# Patient Record
Sex: Female | Born: 1950
Health system: Southern US, Community
[De-identification: ages and names within clinical notes are randomized; demographics above are authoritative.]

## PROBLEM LIST (undated history)

## (undated) DIAGNOSIS — M1990 Unspecified osteoarthritis, unspecified site: Secondary | ICD-10-CM

## (undated) DIAGNOSIS — D219 Benign neoplasm of connective and other soft tissue, unspecified: Secondary | ICD-10-CM

## (undated) DIAGNOSIS — I10 Essential (primary) hypertension: Secondary | ICD-10-CM

## (undated) DIAGNOSIS — K219 Gastro-esophageal reflux disease without esophagitis: Secondary | ICD-10-CM

## (undated) DIAGNOSIS — E7800 Pure hypercholesterolemia, unspecified: Secondary | ICD-10-CM

## (undated) HISTORY — PX: RETINAL TEAR REPAIR CRYOTHERAPY: SHX5304

## (undated) HISTORY — DX: Unspecified osteoarthritis, unspecified site: M19.90

## (undated) HISTORY — DX: Gastro-esophageal reflux disease without esophagitis: K21.9

## (undated) HISTORY — DX: Essential (primary) hypertension: I10

## (undated) HISTORY — DX: Benign neoplasm of connective and other soft tissue, unspecified: D21.9

---

## 1983-11-11 HISTORY — PX: ABDOMINAL HYSTERECTOMY: SHX81

## 1999-07-30 ENCOUNTER — Encounter: Payer: Self-pay | Admitting: Family Medicine

## 1999-07-30 ENCOUNTER — Ambulatory Visit (HOSPITAL_COMMUNITY): Admission: RE | Admit: 1999-07-30 | Discharge: 1999-07-30 | Payer: Self-pay | Admitting: Family Medicine

## 2001-10-29 ENCOUNTER — Other Ambulatory Visit: Admission: RE | Admit: 2001-10-29 | Discharge: 2001-10-29 | Payer: Self-pay | Admitting: Obstetrics and Gynecology

## 2003-07-27 ENCOUNTER — Ambulatory Visit (HOSPITAL_COMMUNITY): Admission: RE | Admit: 2003-07-27 | Discharge: 2003-07-27 | Payer: Self-pay | Admitting: Obstetrics and Gynecology

## 2003-07-27 ENCOUNTER — Encounter: Payer: Self-pay | Admitting: Obstetrics and Gynecology

## 2003-08-11 ENCOUNTER — Other Ambulatory Visit: Admission: RE | Admit: 2003-08-11 | Discharge: 2003-08-11 | Payer: Self-pay | Admitting: Obstetrics and Gynecology

## 2003-08-11 ENCOUNTER — Encounter: Admission: RE | Admit: 2003-08-11 | Discharge: 2003-08-11 | Payer: Self-pay | Admitting: Obstetrics and Gynecology

## 2003-08-11 ENCOUNTER — Encounter: Payer: Self-pay | Admitting: Obstetrics and Gynecology

## 2004-08-02 ENCOUNTER — Ambulatory Visit (HOSPITAL_COMMUNITY): Admission: RE | Admit: 2004-08-02 | Discharge: 2004-08-02 | Payer: Self-pay | Admitting: Obstetrics and Gynecology

## 2004-11-08 ENCOUNTER — Other Ambulatory Visit: Admission: RE | Admit: 2004-11-08 | Discharge: 2004-11-08 | Payer: Self-pay | Admitting: Obstetrics and Gynecology

## 2005-08-29 ENCOUNTER — Encounter: Admission: RE | Admit: 2005-08-29 | Discharge: 2005-08-29 | Payer: Self-pay | Admitting: Obstetrics and Gynecology

## 2005-11-14 ENCOUNTER — Other Ambulatory Visit: Admission: RE | Admit: 2005-11-14 | Discharge: 2005-11-14 | Payer: Self-pay | Admitting: Obstetrics and Gynecology

## 2006-03-20 ENCOUNTER — Ambulatory Visit (HOSPITAL_COMMUNITY): Admission: RE | Admit: 2006-03-20 | Discharge: 2006-03-20 | Payer: Self-pay | Admitting: *Deleted

## 2006-10-02 ENCOUNTER — Encounter: Admission: RE | Admit: 2006-10-02 | Discharge: 2006-10-02 | Payer: Self-pay | Admitting: Obstetrics and Gynecology

## 2007-05-21 ENCOUNTER — Other Ambulatory Visit: Admission: RE | Admit: 2007-05-21 | Discharge: 2007-05-21 | Payer: Self-pay | Admitting: Obstetrics and Gynecology

## 2007-11-09 ENCOUNTER — Encounter: Admission: RE | Admit: 2007-11-09 | Discharge: 2007-11-09 | Payer: Self-pay | Admitting: Obstetrics and Gynecology

## 2008-06-02 ENCOUNTER — Other Ambulatory Visit: Admission: RE | Admit: 2008-06-02 | Discharge: 2008-06-02 | Payer: Self-pay | Admitting: Obstetrics and Gynecology

## 2008-11-24 ENCOUNTER — Ambulatory Visit: Payer: Self-pay | Admitting: Women's Health

## 2008-12-22 ENCOUNTER — Encounter: Admission: RE | Admit: 2008-12-22 | Discharge: 2008-12-22 | Payer: Self-pay | Admitting: Obstetrics and Gynecology

## 2009-05-25 ENCOUNTER — Ambulatory Visit: Payer: Self-pay | Admitting: Women's Health

## 2009-06-08 ENCOUNTER — Other Ambulatory Visit: Admission: RE | Admit: 2009-06-08 | Discharge: 2009-06-08 | Payer: Self-pay | Admitting: Obstetrics and Gynecology

## 2009-06-08 ENCOUNTER — Encounter: Payer: Self-pay | Admitting: Obstetrics and Gynecology

## 2009-06-08 ENCOUNTER — Ambulatory Visit: Payer: Self-pay | Admitting: Obstetrics and Gynecology

## 2010-02-28 ENCOUNTER — Encounter: Admission: RE | Admit: 2010-02-28 | Discharge: 2010-02-28 | Payer: Self-pay | Admitting: Obstetrics and Gynecology

## 2010-08-09 ENCOUNTER — Ambulatory Visit: Payer: Self-pay | Admitting: Obstetrics and Gynecology

## 2010-08-09 ENCOUNTER — Other Ambulatory Visit: Admission: RE | Admit: 2010-08-09 | Discharge: 2010-08-09 | Payer: Self-pay | Admitting: Obstetrics and Gynecology

## 2010-08-16 ENCOUNTER — Ambulatory Visit: Payer: Self-pay | Admitting: Obstetrics and Gynecology

## 2010-10-10 ENCOUNTER — Ambulatory Visit: Payer: Self-pay | Admitting: Obstetrics and Gynecology

## 2011-03-04 ENCOUNTER — Other Ambulatory Visit: Payer: Self-pay | Admitting: Obstetrics and Gynecology

## 2011-03-04 DIAGNOSIS — Z1231 Encounter for screening mammogram for malignant neoplasm of breast: Secondary | ICD-10-CM

## 2011-03-07 ENCOUNTER — Ambulatory Visit
Admission: RE | Admit: 2011-03-07 | Discharge: 2011-03-07 | Disposition: A | Discharge status: Home / Self Care | Payer: Commercial Indemnity | Source: Ambulatory Visit | Attending: Obstetrics and Gynecology | Admitting: Obstetrics and Gynecology

## 2011-03-07 ENCOUNTER — Ambulatory Visit: Payer: Self-pay

## 2011-03-07 DIAGNOSIS — Z1231 Encounter for screening mammogram for malignant neoplasm of breast: Secondary | ICD-10-CM

## 2011-03-28 NOTE — Op Note (Signed)
NAMENISHI, NEISWONGER              ACCOUNT NO.:  [id number]   MEDICAL RECORD NO.:  [id number]          PATIENT TYPE:  AMB   LOCATION:  ENDO                         FACILITY:  [facility]   PHYSICIAN:  Georgiana Spinner, M.D.    DATE OF BIRTH:  17-Oct-1951   DATE OF PROCEDURE:  03/20/2006  DATE OF DISCHARGE:                                 OPERATIVE REPORT   PROCEDURE:  Colonoscopy.   INDICATIONS:  Colon cancer screening.   ANESTHESIA:  Demerol 100, Versed 9 mg.   PROCEDURE:  With the patient mildly sedated in the left lateral decubitus  position, the Olympus videoscopic colonoscope was inserted in the rectum and  passed under direct vision with pressure applied. We reached the cecum,  identified by ileocecal valve and appendiceal orifice, both of which were  photographed. From this point, the colonoscope was slowly withdrawn, taking  circumferential views of colonic mucosa stopping in the rectum which  appeared normal on direct and showed hemorrhoids on retroflexed view.  The  patient could not hold the air in very well to do a retroflexed view, but we  what we saw was just internal hemorrhoids.  The endoscope was straightened  and withdrawn.  The patient's vital signs and pulse oximeter remained  stable.  The patient tolerated procedure well without apparent  complications.   FINDINGS:  Internal hemorrhoids. Otherwise an unremarkable examination.   PLAN:  Consider repeat examination in 5 to 10 years.           ______________________________  Georgiana Spinner, M.D.     GMO/MEDQ  D:  03/20/2006  T:  03/21/2006  Job:  [id number]

## 2011-08-15 ENCOUNTER — Encounter: Payer: Commercial Indemnity | Admitting: Obstetrics and Gynecology

## 2011-08-18 ENCOUNTER — Encounter: Payer: Self-pay | Admitting: Gynecology

## 2011-08-18 DIAGNOSIS — M8580 Other specified disorders of bone density and structure, unspecified site: Secondary | ICD-10-CM | POA: Insufficient documentation

## 2011-08-18 DIAGNOSIS — M1990 Unspecified osteoarthritis, unspecified site: Secondary | ICD-10-CM | POA: Insufficient documentation

## 2011-08-18 DIAGNOSIS — D219 Benign neoplasm of connective and other soft tissue, unspecified: Secondary | ICD-10-CM | POA: Insufficient documentation

## 2011-08-22 ENCOUNTER — Ambulatory Visit (INDEPENDENT_AMBULATORY_CARE_PROVIDER_SITE_OTHER): Payer: Managed Care, Other (non HMO) | Admitting: Obstetrics and Gynecology

## 2011-08-22 ENCOUNTER — Other Ambulatory Visit (HOSPITAL_COMMUNITY)
Admission: RE | Admit: 2011-08-22 | Discharge: 2011-08-22 | Disposition: A | Discharge status: Home / Self Care | Payer: Managed Care, Other (non HMO) | Source: Ambulatory Visit | Attending: Obstetrics and Gynecology | Admitting: Obstetrics and Gynecology

## 2011-08-22 ENCOUNTER — Encounter: Payer: Self-pay | Admitting: Obstetrics and Gynecology

## 2011-08-22 VITALS — BP 120/70 | Ht 62.75 in | Wt 172.0 lb

## 2011-08-22 DIAGNOSIS — Z01419 Encounter for gynecological examination (general) (routine) without abnormal findings: Secondary | ICD-10-CM

## 2011-08-22 NOTE — Progress Notes (Signed)
The patient came back to see me today for her annual GYN exam. She is complaining of reduced libido and inability to have an orgasm. She seems to be doing well menopausally otherwise. Her last bone density was a year ago and she has low bone mass. She does not have an elevated FRAX risk. She is up-to-date on mammograms. She is having no pelvic pain or vaginal bleeding. She is her lab work through PCP.  ROS: 12 system review done. Only positive is arthritis.  HEENT: Within normal limits. Neck: No masses. Supraclavicular lymph nodes: Not enlarged. Breasts: Examined in both sitting and lying position. Symmetrical without skin changes or masses. Abdomen: Soft no masses guarding or rebound. No hernias. Pelvic: External within normal limits. BUS within normal limits. Vaginal examination shows good estrogen effect, no cystocele enterocele or rectocele. Cervix and uterus absent. Adnexa within normal limits. Rectovaginal confirmatory. Extremities within normal limits.   Assessment: Menopausal symptoms consisting of reduced libido.  Plan: Testosterone cream 2%. Explicit instructions given.

## 2012-02-20 ENCOUNTER — Other Ambulatory Visit: Payer: Self-pay | Admitting: Obstetrics and Gynecology

## 2012-02-20 DIAGNOSIS — Z1231 Encounter for screening mammogram for malignant neoplasm of breast: Secondary | ICD-10-CM

## 2012-03-09 ENCOUNTER — Ambulatory Visit
Admission: RE | Admit: 2012-03-09 | Discharge: 2012-03-09 | Disposition: A | Discharge status: Home / Self Care | Payer: Managed Care, Other (non HMO) | Source: Ambulatory Visit | Attending: Obstetrics and Gynecology | Admitting: Obstetrics and Gynecology

## 2012-03-09 DIAGNOSIS — Z1231 Encounter for screening mammogram for malignant neoplasm of breast: Secondary | ICD-10-CM

## 2012-08-26 ENCOUNTER — Encounter: Payer: Managed Care, Other (non HMO) | Admitting: Obstetrics and Gynecology

## 2012-10-01 ENCOUNTER — Encounter: Payer: Self-pay | Admitting: Obstetrics and Gynecology

## 2012-10-01 ENCOUNTER — Other Ambulatory Visit (HOSPITAL_COMMUNITY)
Admission: RE | Admit: 2012-10-01 | Discharge: 2012-10-01 | Disposition: A | Discharge status: Home / Self Care | Payer: Managed Care, Other (non HMO) | Source: Ambulatory Visit | Attending: Obstetrics and Gynecology | Admitting: Obstetrics and Gynecology

## 2012-10-01 ENCOUNTER — Ambulatory Visit (INDEPENDENT_AMBULATORY_CARE_PROVIDER_SITE_OTHER): Payer: Managed Care, Other (non HMO) | Admitting: Obstetrics and Gynecology

## 2012-10-01 VITALS — BP 130/80 | Ht 62.0 in | Wt 178.0 lb

## 2012-10-01 DIAGNOSIS — Z1151 Encounter for screening for human papillomavirus (HPV): Secondary | ICD-10-CM | POA: Insufficient documentation

## 2012-10-01 DIAGNOSIS — Z01419 Encounter for gynecological examination (general) (routine) without abnormal findings: Secondary | ICD-10-CM

## 2012-10-01 NOTE — Addendum Note (Signed)
Addended by: Dayna Barker on: 10/01/2012 09:51 AM   Modules accepted: Orders

## 2012-10-01 NOTE — Progress Notes (Signed)
Patient came to see me today for her annual GYN exam. She was complaining of vaginal dryness. She said we have given her cream a year ago but she didn't get it filled because of the expense. In reviewing her record this was testosterone cream not estrogen cream. She had a total abdominal hysterectomy in 1985 for symptomatic fibroids. She is having no vaginal bleeding. She is having no pelvic pain. She a normal mammogram this year. She has always had normal Pap smears. Her last Pap smear was 2012. She has change sexual partners since I last saw her. She has osteopenia on bone density without an elevated fracture risk. Her last bone density was 2011. She does her lab through PCP. She has had colonoscopy.  HEENT: Within normal limits.Kennon Portela present. Neck: No masses. Supraclavicular lymph nodes: Not enlarged. Breasts: Examined in both sitting and lying position. Symmetrical without skin changes or masses. Abdomen: Soft no masses guarding or rebound. No hernias. Pelvic: External within normal limits. BUS within normal limits. Vaginal examination shows poor estrogen effect, no cystocele enterocele or rectocele. Cervix and uterus absent. Adnexa within normal limits. Rectovaginal confirmatory.  Extremities within normal limits.  Assessment: #1. Atrophic vaginitis #2. Osteopenia  Plan: Explained to her that the testosterone was for libido and not for vaginal dryness. Discussed options including estrogen cream. She will use over-the-counter medication. Information on hyalo gyn  gel given. The new Pap smear guidelines were discussed with the patient. Pap done. Bone density. Continue yearly mammograms.

## 2012-10-01 NOTE — Patient Instructions (Addendum)
Schedule bone density.    

## 2012-10-02 LAB — URINALYSIS W MICROSCOPIC + REFLEX CULTURE
Casts: NONE SEEN
Crystals: NONE SEEN
Glucose, UA: NEGATIVE mg/dL
Hgb urine dipstick: NEGATIVE
Nitrite: NEGATIVE
Urobilinogen, UA: 0.2 mg/dL (ref 0.0–1.0)

## 2012-10-04 LAB — URINE CULTURE: Colony Count: >=100000

## 2012-10-05 ENCOUNTER — Other Ambulatory Visit: Payer: Self-pay | Admitting: Obstetrics and Gynecology

## 2012-10-05 DIAGNOSIS — N390 Urinary tract infection, site not specified: Secondary | ICD-10-CM

## 2012-10-05 MED ORDER — NITROFURANTOIN MONOHYD MACRO 100 MG PO CAPS: 100.0000 mg | ORAL_CAPSULE | Freq: Two times a day (BID) | ORAL | Status: AC

## 2012-10-12 ENCOUNTER — Encounter: Payer: Self-pay | Admitting: Obstetrics and Gynecology

## 2012-10-15 ENCOUNTER — Other Ambulatory Visit: Payer: Managed Care, Other (non HMO)

## 2012-10-15 DIAGNOSIS — N390 Urinary tract infection, site not specified: Secondary | ICD-10-CM

## 2012-10-16 LAB — URINALYSIS W MICROSCOPIC + REFLEX CULTURE
Casts: NONE SEEN
Glucose, UA: NEGATIVE mg/dL
Hgb urine dipstick: NEGATIVE
Ketones, ur: NEGATIVE mg/dL
Leukocytes, UA: NEGATIVE
Nitrite: NEGATIVE
Protein, ur: NEGATIVE mg/dL
Squamous Epithelial / LPF: NONE SEEN
Urobilinogen, UA: 0.2 mg/dL (ref 0.0–1.0)
pH: 6.5 (ref 5.0–8.0)

## 2012-11-15 ENCOUNTER — Other Ambulatory Visit: Payer: Self-pay | Admitting: Women's Health

## 2012-11-15 DIAGNOSIS — M8580 Other specified disorders of bone density and structure, unspecified site: Secondary | ICD-10-CM

## 2013-02-07 ENCOUNTER — Other Ambulatory Visit: Payer: Self-pay

## 2013-02-07 DIAGNOSIS — Z1231 Encounter for screening mammogram for malignant neoplasm of breast: Secondary | ICD-10-CM

## 2013-03-03 ENCOUNTER — Other Ambulatory Visit: Payer: Self-pay | Admitting: Gynecology

## 2013-03-03 DIAGNOSIS — M8580 Other specified disorders of bone density and structure, unspecified site: Secondary | ICD-10-CM

## 2013-03-10 ENCOUNTER — Ambulatory Visit (INDEPENDENT_AMBULATORY_CARE_PROVIDER_SITE_OTHER): Payer: Managed Care, Other (non HMO)

## 2013-03-10 ENCOUNTER — Encounter: Payer: Self-pay | Admitting: Gynecology

## 2013-03-10 ENCOUNTER — Ambulatory Visit
Admission: RE | Admit: 2013-03-10 | Discharge: 2013-03-10 | Disposition: A | Discharge status: Home / Self Care | Payer: Managed Care, Other (non HMO) | Source: Ambulatory Visit

## 2013-03-10 DIAGNOSIS — M949 Disorder of cartilage, unspecified: Secondary | ICD-10-CM

## 2013-03-10 DIAGNOSIS — Z1231 Encounter for screening mammogram for malignant neoplasm of breast: Secondary | ICD-10-CM

## 2013-03-10 DIAGNOSIS — M8580 Other specified disorders of bone density and structure, unspecified site: Secondary | ICD-10-CM

## 2013-03-11 ENCOUNTER — Ambulatory Visit: Payer: Managed Care, Other (non HMO)

## 2013-03-15 ENCOUNTER — Other Ambulatory Visit: Payer: Self-pay | Admitting: *Deleted

## 2013-03-15 DIAGNOSIS — M8580 Other specified disorders of bone density and structure, unspecified site: Secondary | ICD-10-CM

## 2013-03-17 ENCOUNTER — Other Ambulatory Visit: Payer: Managed Care, Other (non HMO)

## 2013-03-17 DIAGNOSIS — M8580 Other specified disorders of bone density and structure, unspecified site: Secondary | ICD-10-CM

## 2013-03-18 ENCOUNTER — Other Ambulatory Visit: Payer: Managed Care, Other (non HMO)

## 2013-03-18 LAB — VITAMIN D 25 HYDROXY (VIT D DEFICIENCY, FRACTURES): Vit D, 25-Hydroxy: 49 ng/mL (ref 30–89)

## 2013-10-04 ENCOUNTER — Encounter: Payer: Managed Care, Other (non HMO) | Admitting: Women's Health

## 2013-10-14 ENCOUNTER — Ambulatory Visit (INDEPENDENT_AMBULATORY_CARE_PROVIDER_SITE_OTHER): Payer: Managed Care, Other (non HMO) | Admitting: Women's Health

## 2013-10-14 ENCOUNTER — Encounter: Payer: Self-pay | Admitting: Women's Health

## 2013-10-14 VITALS — BP 124/82 | Ht 62.0 in | Wt 193.8 lb

## 2013-10-14 DIAGNOSIS — D259 Leiomyoma of uterus, unspecified: Secondary | ICD-10-CM

## 2013-10-14 DIAGNOSIS — D219 Benign neoplasm of connective and other soft tissue, unspecified: Secondary | ICD-10-CM

## 2013-10-14 DIAGNOSIS — Z01419 Encounter for gynecological examination (general) (routine) without abnormal findings: Secondary | ICD-10-CM

## 2013-10-14 LAB — URINALYSIS W MICROSCOPIC + REFLEX CULTURE
Ketones, ur: NEGATIVE mg/dL
Nitrite: NEGATIVE
pH: 6.5 (ref 5.0–8.0)

## 2013-10-14 NOTE — Patient Instructions (Signed)
Health Recommendations for Postmenopausal Women Respected and ongoing research has looked at the most common causes of death, disability, and poor quality of life in postmenopausal women. The causes include heart disease, diseases of blood vessels, diabetes, depression, cancer, and bone loss (osteoporosis). Many things can be done to help lower the chances of developing these and other common problems: CARDIOVASCULAR DISEASE Heart Disease: A heart attack is a medical emergency. Know the signs and symptoms of a heart attack. Below are things women can do to reduce their risk for heart disease.   Do not smoke. If you smoke, quit.  Aim for a healthy weight. Being overweight causes many preventable deaths. Eat a healthy and balanced diet and drink an adequate amount of liquids.  Get moving. Make a commitment to be more physically active. Aim for 30 minutes of activity on most, if not all days of the week.  Eat for heart health. Choose a diet that is low in saturated fat and cholesterol and eliminate trans fat. Include whole grains, vegetables, and fruits. Read and understand the labels on food containers before buying.  Know your numbers. Ask your caregiver to check your blood pressure, cholesterol (total, HDL, LDL, triglycerides) and blood glucose. Work with your caregiver on improving your entire clinical picture.  High blood pressure. Limit or stop your table salt intake (try salt substitute and food seasonings). Avoid salty foods and drinks. Read labels on food containers before buying. Eating well and exercising can help control high blood pressure. STROKE  Stroke is a medical emergency. Stroke may be the result of a blood clot in a blood vessel in the brain or by a brain hemorrhage (bleeding). Know the signs and symptoms of a stroke. To lower the risk of developing a stroke:  Avoid fatty foods.  Quit smoking.  Control your diabetes, blood pressure, and irregular heart rate. THROMBOPHLEBITIS  (BLOOD CLOT) OF THE LEG  Becoming overweight and leading a stationary lifestyle may also contribute to developing blood clots. Controlling your diet and exercising will help lower the risk of developing blood clots. CANCER SCREENING  Breast Cancer: Take steps to reduce your risk of breast cancer.  You should practice "breast self-awareness." This means understanding the normal appearance and feel of your breasts and should include breast self-examination. Any changes detected, no matter how small, should be reported to your caregiver.  After age 40, you should have a clinical breast exam (CBE) every year.  Starting at age 40, you should consider having a mammogram (breast X-ray) every year.  If you have a family history of breast cancer, talk to your caregiver about genetic screening.  If you are at high risk for breast cancer, talk to your caregiver about having an MRI and a mammogram every year.  Intestinal or Stomach Cancer: Tests to consider are a rectal exam, fecal occult blood, sigmoidoscopy, and colonoscopy. Women who are high risk may need to be screened at an earlier age and more often.  Cervical Cancer:  Beginning at age 30, you should have a Pap test every 3 years as long as the past 3 Pap tests have been normal.  If you have had past treatment for cervical cancer or a condition that could lead to cancer, you need Pap tests and screening for cancer for at least 20 years after your treatment.  If you had a hysterectomy for a problem that was not cancer or a condition that could lead to cancer, then you no longer need Pap tests.    If you are between ages 65 and 70, and you have had normal Pap tests going back 10 years, you no longer need Pap tests.  If Pap tests have been discontinued, risk factors (such as a new sexual partner) need to be reassessed to determine if screening should be resumed.  Some medical problems can increase the chance of getting cervical cancer. In these  cases, your caregiver may recommend more frequent screening and Pap tests.  Uterine Cancer: If you have vaginal bleeding after reaching menopause, you should notify your caregiver.  Ovarian cancer: Other than yearly pelvic exams, there are no reliable tests available to screen for ovarian cancer at this time except for yearly pelvic exams.  Lung Cancer: Yearly chest X-rays can detect lung cancer and should be done on high risk women, such as cigarette smokers and women with chronic lung disease (emphysema).  Skin Cancer: A complete body skin exam should be done at your yearly examination. Avoid overexposure to the sun and ultraviolet light lamps. Use a strong sun block cream when in the sun. All of these things are important in lowering the risk of skin cancer. MENOPAUSE Menopause Symptoms: Hormone therapy products are effective for treating symptoms associated with menopause:  Moderate to severe hot flashes.  Night sweats.  Mood swings.  Headaches.  Tiredness.  Loss of sex drive.  Insomnia.  Other symptoms. Hormone replacement carries certain risks, especially in older women. Women who use or are thinking about using estrogen or estrogen with progestin treatments should discuss that with their caregiver. Your caregiver will help you understand the benefits and risks. The ideal dose of hormone replacement therapy is not known. The Food and Drug Administration (FDA) has concluded that hormone therapy should be used only at the lowest doses and for the shortest amount of time to reach treatment goals.  OSTEOPOROSIS Protecting Against Bone Loss and Preventing Fracture: If you use hormone therapy for prevention of bone loss (osteoporosis), the risks for bone loss must outweigh the risk of the therapy. Ask your caregiver about other medications known to be safe and effective for preventing bone loss and fractures. To guard against bone loss or fractures, the following is recommended:  If  you are less than age 50, take 1000 mg of calcium and at least 600 mg of Vitamin D per day.  If you are greater than age 50 but less than age 70, take 1200 mg of calcium and at least 600 mg of Vitamin D per day.  If you are greater than age 70, take 1200 mg of calcium and at least 800 mg of Vitamin D per day. Smoking and excessive alcohol intake increases the risk of osteoporosis. Eat foods rich in calcium and vitamin D and do weight bearing exercises several times a week as your caregiver suggests. DIABETES Diabetes Melitus: If you have Type I or Type 2 diabetes, you should keep your blood sugar under control with diet, exercise and recommended medication. Avoid too many sweets, starchy and fatty foods. Being overweight can make control more difficult. COGNITION AND MEMORY Cognition and Memory: Menopausal hormone therapy is not recommended for the prevention of cognitive disorders such as Alzheimer's disease or memory loss.  DEPRESSION  Depression may occur at any age, but is common in elderly women. The reasons may be because of physical, medical, social (loneliness), or financial problems and needs. If you are experiencing depression because of medical problems and control of symptoms, talk to your caregiver about this. Physical activity and   exercise may help with mood and sleep. Community and volunteer involvement may help your sense of value and worth. If you have depression and you feel that the problem is getting worse or becoming severe, talk to your caregiver about treatment options that are best for you. ACCIDENTS  Accidents are common and can be serious in the elderly woman. Prepare your house to prevent accidents. Eliminate throw rugs, place hand bars in the bath, shower and toilet areas. Avoid wearing high heeled shoes or walking on wet, snowy, and icy areas. Limit or stop driving if you have vision or hearing problems, or you feel you are unsteady with you movements and  reflexes. HEPATITIS C Hepatitis C is a type of viral infection affecting the liver. It is spread mainly through contact with blood from an infected person. It can be treated, but if left untreated, it can lead to severe liver damage over years. Many people who are infected do not know that the virus is in their blood. If you are a "baby-boomer", it is recommended that you have one screening test for Hepatitis C. IMMUNIZATIONS  Several immunizations are important to consider having during your senior years, including:   Tetanus, diptheria, and pertussis booster shot.  Influenza every year before the flu season begins.  Pneumonia vaccine.  Shingles vaccine.  Others as indicated based on your specific needs. Talk to your caregiver about these. Document Released: 12/19/2005 Document Revised: 10/13/2012 Document Reviewed: 08/14/2008 [employer] Patient Information 2014 [employer].  

## 2013-10-14 NOTE — Progress Notes (Signed)
Heather Taylor [birth year] [id number]    History:    The patient presents for annual exam.  TAH for fibroids 1985/no HRT. Osteopenia T score -1.5 femoral neck FRAX 3.4%/0.3% 2014. Mammograms and Paps normal. Negative colonoscopy 2008. Same partner.   Past medical history, past surgical history, [name] Works in a factory. Mother hypothyroid/hypercholesterolemia. Father and brother heart disease/hypertension.   ROS:  A  ROS was performed and pertinent positives and negatives are included in the history.  Exam:  Filed Vitals:   10/14/13 0806  BP: 124/82    General appearance:  Normal Head/Neck:  Normal, without cervical or supraclavicular adenopathy. Thyroid:  Symmetrical, normal in size, without palpable masses or nodularity. Respiratory  Effort:  Normal  Auscultation:  Clear without wheezing or rhonchi Cardiovascular  Auscultation:  Regular rate, without rubs, murmurs or gallops  Edema/varicosities:  Not grossly evident Abdominal  Soft,nontender, without masses, guarding or rebound.  Liver/spleen:  No organomegaly noted  Hernia:  None appreciated  Skin  Inspection:  Grossly normal  Palpation:  Grossly normal Neurologic/psychiatric  Orientation:  Normal with appropriate conversation.  Mood/affect:  Normal  Genitourinary    Breasts: Examined lying and sitting.     Right: Without masses, retractions, discharge or axillary adenopathy.     Left: Without masses, retractions, discharge or axillary adenopathy.   Inguinal/mons:  Normal without inguinal adenopathy  External genitalia:  Normal  BUS/Urethra/Skene's glands:  Normal  Bladder:  Normal  Vagina:  Normal  Cervix:  Absent  Uterus:  Absent  Adnexa/parametria:     Rt: Without masses or tenderness.   Lt: Without masses or tenderness.  Anus and perineum: Normal  Digital rectal exam: Normal sphincter tone without palpated masses or  tenderness  Assessment/Plan:  62 y.o. DBF G0 for annual exam.   Obesity Osteopenia Osteoarthritis-primary care manages  Plan: Reviewed importance of home safety, fall prevention, regular exercise, calcium rich diet, vitamin D 2000 daily encouraged. SBE's, continue annual mammogram, 3D tomography reviewed and encouraged, history of dense breasts. UA, Fosamax vaccine reviewed and encouraged. Labs at primary care. Has gained 15 pounds in the past year reviewed importance of decreasing calories and increasing exercise for weight loss and maintenance.    Harrington Challenger [facility], 9:04 AM 10/14/2013

## 2013-10-16 LAB — URINE CULTURE: Colony Count: >=100000

## 2014-02-17 ENCOUNTER — Other Ambulatory Visit: Payer: Self-pay

## 2014-02-17 DIAGNOSIS — Z1231 Encounter for screening mammogram for malignant neoplasm of breast: Secondary | ICD-10-CM

## 2014-03-17 ENCOUNTER — Ambulatory Visit
Admission: RE | Admit: 2014-03-17 | Discharge: 2014-03-17 | Disposition: A | Discharge status: Home / Self Care | Payer: Managed Care, Other (non HMO) | Source: Ambulatory Visit

## 2014-03-17 ENCOUNTER — Encounter (INDEPENDENT_AMBULATORY_CARE_PROVIDER_SITE_OTHER): Payer: Self-pay

## 2014-03-17 DIAGNOSIS — Z1231 Encounter for screening mammogram for malignant neoplasm of breast: Secondary | ICD-10-CM

## 2014-10-20 ENCOUNTER — Encounter: Payer: Self-pay | Admitting: Women's Health

## 2014-10-20 ENCOUNTER — Ambulatory Visit (INDEPENDENT_AMBULATORY_CARE_PROVIDER_SITE_OTHER): Payer: Managed Care, Other (non HMO) | Admitting: Women's Health

## 2014-10-20 VITALS — BP 138/80 | Ht 61.0 in | Wt 198.0 lb

## 2014-10-20 DIAGNOSIS — Z01419 Encounter for gynecological examination (general) (routine) without abnormal findings: Secondary | ICD-10-CM

## 2014-10-20 NOTE — Progress Notes (Signed)
Heather Taylor [birth year] [id number]    History:    Presents for annual exam.  Postmenopausal no HRT. 1985 TAH for fibroids/menorrhagia. 2014 DEXA T score +1.5 FRAX 3.4%/0.3%. Normal Pap and mammogram history. 2008 negative colonoscopy.  Past medical history, past surgical history, [name] Works in a factory active job. Mother hypothyroid hypercholesteremia. Father, brother hypertension and heart disease.  ROS:  A  12 point ROS was performed and pertinent positives and negatives are included.  Exam:  Filed Vitals:   10/20/14 0953  BP: 138/80    General appearance:  Normal Thyroid:  Symmetrical, normal in size, without palpable masses or nodularity. Respiratory  Auscultation:  Clear without wheezing or rhonchi Cardiovascular  Auscultation:  Regular rate, without rubs, murmurs or gallops  Edema/varicosities:  Not grossly evident Abdominal  Soft,nontender, without masses, guarding or rebound.  Liver/spleen:  No organomegaly noted  Hernia:  None appreciated  Skin  Inspection:  Grossly normal   Breasts: Examined lying and sitting.     Right: Without masses, retractions, discharge or axillary adenopathy.     Left: Without masses, retractions, discharge or axillary adenopathy. Gentitourinary   Inguinal/mons:  Normal without inguinal adenopathy  External genitalia:  Normal  BUS/Urethra/Skene's glands:  Normal  Vagina:  Normal  Cervix:  And uterus absent  Adnexa/parametria:     Rt: Without masses or tenderness.   Lt: Without masses or tenderness.  Anus and perineum: Normal  Digital rectal exam: Normal sphincter tone without palpated masses or tenderness  Assessment/Plan:  63 y.o. DBF G0 for annual exam.    Obesity Postmenopausal TAH no HRT  Plan: SBE's, continue annual mammogram, calcium rich diet, vitamin D 2000 daily encouraged. Reports all labs as normal has annual exam with primary care in  January we'll get a vitamin D level checked. UA. Pap screening guidelines reviewed.. Zostavax recommended.    [name] [facility], 11:21 AM 10/20/2014

## 2014-10-20 NOTE — Patient Instructions (Signed)
Health Recommendations for Postmenopausal Women Respected and ongoing research has looked at the most common causes of death, disability, and poor quality of life in postmenopausal women. The causes include heart disease, diseases of blood vessels, diabetes, depression, cancer, and bone loss (osteoporosis). Many things can be done to help lower the chances of developing these and other common problems. CARDIOVASCULAR DISEASE Heart Disease: A heart attack is a medical emergency. Know the signs and symptoms of a heart attack. Below are things women can do to reduce their risk for heart disease.   Do not smoke. If you smoke, quit.  Aim for a healthy weight. Being overweight causes many preventable deaths. Eat a healthy and balanced diet and drink an adequate amount of liquids.  Get moving. Make a commitment to be more physically active. Aim for 30 minutes of activity on most, if not all days of the week.  Eat for heart health. Choose a diet that is low in saturated fat and cholesterol and eliminate trans fat. Include whole grains, vegetables, and fruits. Read and understand the labels on food containers before buying.  Know your numbers. Ask your caregiver to check your blood pressure, cholesterol (total, HDL, LDL, triglycerides) and blood glucose. Work with your caregiver on improving your entire clinical picture.  High blood pressure. Limit or stop your table salt intake (try salt substitute and food seasonings). Avoid salty foods and drinks. Read labels on food containers before buying. Eating well and exercising can help control high blood pressure. STROKE  Stroke is a medical emergency. Stroke may be the result of a blood clot in a blood vessel in the brain or by a brain hemorrhage (bleeding). Know the signs and symptoms of a stroke. To lower the risk of developing a stroke:  Avoid fatty foods.  Quit smoking.  Control your diabetes, blood pressure, and irregular heart rate. THROMBOPHLEBITIS  (BLOOD CLOT) OF THE LEG  Becoming overweight and leading a stationary lifestyle may also contribute to developing blood clots. Controlling your diet and exercising will help lower the risk of developing blood clots. CANCER SCREENING  Breast Cancer: Take steps to reduce your risk of breast cancer.  You should practice "breast self-awareness." This means understanding the normal appearance and feel of your breasts and should include breast self-examination. Any changes detected, no matter how small, should be reported to your caregiver.  After age 40, you should have a clinical breast exam (CBE) every year.  Starting at age 40, you should consider having a mammogram (breast X-ray) every year.  If you have a family history of breast cancer, talk to your caregiver about genetic screening.  If you are at high risk for breast cancer, talk to your caregiver about having an MRI and a mammogram every year.  Intestinal or Stomach Cancer: Tests to consider are a rectal exam, fecal occult blood, sigmoidoscopy, and colonoscopy. Women who are high risk may need to be screened at an earlier age and more often.  Cervical Cancer:  Beginning at age 30, you should have a Pap test every 3 years as long as the past 3 Pap tests have been normal.  If you have had past treatment for cervical cancer or a condition that could lead to cancer, you need Pap tests and screening for cancer for at least 20 years after your treatment.  If you had a hysterectomy for a problem that was not cancer or a condition that could lead to cancer, then you no longer need Pap tests.    If you are between ages 65 and 70, and you have had normal Pap tests going back 10 years, you no longer need Pap tests.  If Pap tests have been discontinued, risk factors (such as a new sexual partner) need to be reassessed to determine if screening should be resumed.  Some medical problems can increase the chance of getting cervical cancer. In these  cases, your caregiver may recommend more frequent screening and Pap tests.  Uterine Cancer: If you have vaginal bleeding after reaching menopause, you should notify your caregiver.  Ovarian Cancer: Other than yearly pelvic exams, there are no reliable tests available to screen for ovarian cancer at this time except for yearly pelvic exams.  Lung Cancer: Yearly chest X-rays can detect lung cancer and should be done on high risk women, such as cigarette smokers and women with chronic lung disease (emphysema).  Skin Cancer: A complete body skin exam should be done at your yearly examination. Avoid overexposure to the sun and ultraviolet light lamps. Use a strong sun block cream when in the sun. All of these things are important for lowering the risk of skin cancer. MENOPAUSE Menopause Symptoms: Hormone therapy products are effective for treating symptoms associated with menopause:  Moderate to severe hot flashes.  Night sweats.  Mood swings.  Headaches.  Tiredness.  Loss of sex drive.  Insomnia.  Other symptoms. Hormone replacement carries certain risks, especially in older women. Women who use or are thinking about using estrogen or estrogen with progestin treatments should discuss that with their caregiver. Your caregiver will help you understand the benefits and risks. The ideal dose of hormone replacement therapy is not known. The Food and Drug Administration (FDA) has concluded that hormone therapy should be used only at the lowest doses and for the shortest amount of time to reach treatment goals.  OSTEOPOROSIS Protecting Against Bone Loss and Preventing Fracture If you use hormone therapy for prevention of bone loss (osteoporosis), the risks for bone loss must outweigh the risk of the therapy. Ask your caregiver about other medications known to be safe and effective for preventing bone loss and fractures. To guard against bone loss or fractures, the following is recommended:  If  you are younger than age 50, take 1000 mg of calcium and at least 600 mg of Vitamin D per day.  If you are older than age 50 but younger than age 70, take 1200 mg of calcium and at least 600 mg of Vitamin D per day.  If you are older than age 70, take 1200 mg of calcium and at least 800 mg of Vitamin D per day. Smoking and excessive alcohol intake increases the risk of osteoporosis. Eat foods rich in calcium and vitamin D and do weight bearing exercises several times a week as your caregiver suggests. DIABETES Diabetes Mellitus: If you have type I or type 2 diabetes, you should keep your blood sugar under control with diet, exercise, and recommended medication. Avoid starchy and fatty foods, and too many sweets. Being overweight can make diabetes control more difficult. COGNITION AND MEMORY Cognition and Memory: Menopausal hormone therapy is not recommended for the prevention of cognitive disorders such as Alzheimer's disease or memory loss.  DEPRESSION  Depression may occur at any age, but it is common in elderly women. This may be because of physical, medical, social (loneliness), or financial problems and needs. If you are experiencing depression because of medical problems and control of symptoms, talk to your caregiver about this. Physical   activity and exercise may help with mood and sleep. Community and volunteer involvement may improve your sense of value and worth. If you have depression and you feel that the problem is getting worse or becoming severe, talk to your caregiver about which treatment options are best for you. ACCIDENTS  Accidents are common and can be serious in elderly woman. Prepare your house to prevent accidents. Eliminate throw rugs, place hand bars in bath, shower, and toilet areas. Avoid wearing high heeled shoes or walking on wet, snowy, and icy areas. Limit or stop driving if you have vision or hearing problems, or if you feel you are unsteady with your movements and  reflexes. HEPATITIS C Hepatitis C is a type of viral infection affecting the liver. It is spread mainly through contact with blood from an infected person. It can be treated, but if left untreated, it can lead to severe liver damage over the years. Many people who are infected do not know that the virus is in their blood. If you are a "baby-boomer", it is recommended that you have one screening test for Hepatitis C. IMMUNIZATIONS  Several immunizations are important to consider having during your senior years, including:   Tetanus, diphtheria, and pertussis booster shot.  Influenza every year before the flu season begins.  Pneumonia vaccine.  Shingles vaccine.  Others, as indicated based on your specific needs. Talk to your caregiver about these. Document Released: 12/19/2005 Document Revised: 03/13/2014 Document Reviewed: 08/14/2008 [employer] Patient Information 2015 [employer]. This information is not intended to replace advice given to you by your health care provider. Make sure you discuss any questions you have with your health care provider.  

## 2015-02-14 ENCOUNTER — Other Ambulatory Visit: Payer: Self-pay

## 2015-02-14 DIAGNOSIS — Z1231 Encounter for screening mammogram for malignant neoplasm of breast: Secondary | ICD-10-CM

## 2015-03-23 ENCOUNTER — Ambulatory Visit: Payer: Managed Care, Other (non HMO)

## 2015-03-30 ENCOUNTER — Ambulatory Visit
Admission: RE | Admit: 2015-03-30 | Discharge: 2015-03-30 | Disposition: A | Discharge status: Home / Self Care | Payer: Managed Care, Other (non HMO) | Source: Ambulatory Visit

## 2015-03-30 DIAGNOSIS — Z1231 Encounter for screening mammogram for malignant neoplasm of breast: Secondary | ICD-10-CM

## 2016-03-07 ENCOUNTER — Other Ambulatory Visit: Payer: Self-pay

## 2016-03-07 DIAGNOSIS — Z1231 Encounter for screening mammogram for malignant neoplasm of breast: Secondary | ICD-10-CM

## 2016-04-04 ENCOUNTER — Ambulatory Visit
Admission: RE | Admit: 2016-04-04 | Discharge: 2016-04-04 | Disposition: A | Discharge status: Home / Self Care | Payer: Managed Care, Other (non HMO) | Source: Ambulatory Visit

## 2016-04-04 DIAGNOSIS — Z1231 Encounter for screening mammogram for malignant neoplasm of breast: Secondary | ICD-10-CM

## 2016-05-09 ENCOUNTER — Ambulatory Visit (INDEPENDENT_AMBULATORY_CARE_PROVIDER_SITE_OTHER): Payer: Managed Care, Other (non HMO) | Admitting: Women's Health

## 2016-05-09 ENCOUNTER — Encounter: Payer: Self-pay | Admitting: Women's Health

## 2016-05-09 VITALS — BP 112/79 | Ht 62.0 in | Wt 191.6 lb

## 2016-05-09 DIAGNOSIS — M899 Disorder of bone, unspecified: Secondary | ICD-10-CM | POA: Diagnosis not present

## 2016-05-09 DIAGNOSIS — Z01419 Encounter for gynecological examination (general) (routine) without abnormal findings: Secondary | ICD-10-CM | POA: Diagnosis not present

## 2016-05-09 DIAGNOSIS — M8580 Other specified disorders of bone density and structure, unspecified site: Secondary | ICD-10-CM | POA: Diagnosis not present

## 2016-05-09 DIAGNOSIS — Z13820 Encounter for screening for osteoporosis: Secondary | ICD-10-CM

## 2016-05-09 DIAGNOSIS — Z113 Encounter for screening for infections with a predominantly sexual mode of transmission: Secondary | ICD-10-CM

## 2016-05-09 NOTE — Progress Notes (Signed)
Heather Taylor February 08, 1951 [id number]    History:    Presents for annual exam.  1985 TAH for fibroids and menorrhagia on no HRT. Normal Pap and mammogram history. New partner. 2014 T score -1.5 FRAX 3.4%/0.3%. 2008 negative colonoscopy. Primary care manages labs. Vitamin D 49. Reports normal blood sugar and lipid panel.  Past medical history, past surgical history, [name] Has an active job in a factory. Mother hypothyroid, hypercholesteremia. Brother hypertension and heart disease.  ROS:  A ROS was performed and pertinent positives and negatives are included.  Exam:  Filed Vitals:   05/09/16 0840  BP: 112/79    General appearance:  Normal Thyroid:  Symmetrical, normal in size, without palpable masses or nodularity. Respiratory  Auscultation:  Clear without wheezing or rhonchi Cardiovascular  Auscultation:  Regular rate, without rubs, murmurs or gallops  Edema/varicosities:  Not grossly evident Abdominal  Soft,nontender, without masses, guarding or rebound.  Liver/spleen:  No organomegaly noted  Hernia:  None appreciated  Skin  Inspection:  Grossly normal   Breasts: Examined lying and sitting.     Right: Without masses, retractions, discharge or axillary adenopathy.     Left: Without masses, retractions, discharge or axillary adenopathy. Gentitourinary   Inguinal/mons:  Normal without inguinal adenopathy  External genitalia:  Normal  BUS/Urethra/Skene's glands:  Normal  Vagina:  Normal  Cervix:  And uterus absent  Adnexa/parametria:     Rt: Without masses or tenderness.   Lt: Without masses or tenderness.  Anus and perineum: Normal  Digital rectal exam: Normal sphincter tone without palpated masses or tenderness  Assessment/Plan:  65 y.o. SBF G0 for annual exam no complaints.  1985 TAH for fibroids and menorrhagia on no HRT Obesity Osteopenia without elevated FRAX  Plan: GC/Chlamydia  culture from urethra, declines need for HIV, hepatitis or RPR. Condoms encouraged until permanent partner. Continue vaginal lubricants. SBE's, continue annual screening mammogram, calcium rich diet, vitamin D 1000 daily encouraged. Home safety, fall prevention and importance of weightbearing exercise reviewed. Schedule DEXA.  Heather Taylor Heather Taylor, 9:16 AM 05/09/2016

## 2016-05-09 NOTE — Patient Instructions (Signed)
Basic Carbohydrate Counting for Diabetes Mellitus Carbohydrate counting is a method for keeping track of the amount of carbohydrates you eat. Eating carbohydrates naturally increases the level of sugar (glucose) in your blood, so it is important for you to know the amount that is okay for you to have in every meal. Carbohydrate counting helps keep the level of glucose in your blood within normal limits. The amount of carbohydrates allowed is different for every person. A dietitian can help you calculate the amount that is right for you. Once you know the amount of carbohydrates you can have, you can count the carbohydrates in the foods you want to eat. Carbohydrates are found in the following foods:  Grains, such as breads and cereals.  Dried beans and soy products.  Starchy vegetables, such as potatoes, peas, and corn.  Fruit and fruit juices.  Milk and yogurt.  Sweets and snack foods, such as cake, cookies, candy, chips, soft drinks, and fruit drinks. CARBOHYDRATE COUNTING There are two ways to count the carbohydrates in your food. You can use either of the methods or a combination of both. Reading the "Nutrition Facts" on [place] The "Nutrition Facts" is an area that is included on the labels of almost all packaged food and beverages in the [place]. It includes the serving size of that food or beverage and information about the nutrients in each serving of the food, including the grams (g) of carbohydrate per serving.  Decide the number of servings of this food or beverage that you will be able to eat or drink. Multiply that number of servings by the number of grams of carbohydrate that is listed on the label for that serving. The total will be the amount of carbohydrates you will be having when you eat or drink this food or beverage. Learning Standard Serving Sizes of Food When you eat food that is not packaged or does not include "Nutrition Facts" on the label, you need to  measure the servings in order to count the amount of carbohydrates.A serving of most carbohydrate-rich foods contains about 15 g of carbohydrates. The following list includes serving sizes of carbohydrate-rich foods that provide 15 g ofcarbohydrate per serving:   1 slice of bread (1 oz) or 1 six-inch tortilla.    of a hamburger bun or English muffin.  4-6 crackers.   cup unsweetened dry cereal.    cup hot cereal.   cup rice or pasta.    cup mashed potatoes or  of a large baked potato.  1 cup fresh fruit or one small piece of fruit.    cup canned or frozen fruit or fruit juice.  1 cup milk.   cup plain fat-free yogurt or yogurt sweetened with artificial sweeteners.   cup cooked dried beans or starchy vegetable, such as peas, corn, or potatoes.  Decide the number of standard-size servings that you will eat. Multiply that number of servings by 15 (the grams of carbohydrates in that serving). For example, if you eat 2 cups of strawberries, you will have eaten 2 servings and 30 g of carbohydrates (2 servings x 15 g = 30 g). For foods such as soups and casseroles, in which more than one food is mixed in, you will need to count the carbohydrates in each food that is included. EXAMPLE OF CARBOHYDRATE COUNTING Sample Dinner  3 oz chicken breast.   cup of brown rice.   cup of corn.  1 cup milk.   1 cup strawberries with  sugar-free whipped topping.  Carbohydrate Calculation Step 1: Identify the foods that contain carbohydrates:   Rice.   Corn.   Milk.   Strawberries. Step 2:Calculate the number of servings eaten of each:   2 servings of rice.   1 serving of corn.   1 serving of milk.   1 serving of strawberries. Step 3: Multiply each of those number of servings by 15 g:   2 servings of rice x 15 g = 30 g.   1 serving of corn x 15 g = 15 g.   1 serving of milk x 15 g = 15 g.   1 serving of strawberries x 15 g = 15 g. Step 4: Add  together all of the amounts to find the total grams of carbohydrates eaten: 30 g + 15 g + 15 g + 15 g = 75 g.   This information is not intended to replace advice given to you by your health care provider. Make sure you discuss any questions you have with your health care provider.   Document Released: 10/27/2005 Document Revised: 11/17/2014 Document Reviewed: 09/23/2013 Elsevier Interactive Patient Education [employer]. Menopause is a normal process in which your reproductive ability comes to an end. This process happens gradually over a span of months to years, usually between the ages of 64 and 37. Menopause is complete when you have missed 12 consecutive menstrual periods. It is important to talk with your health care provider about some of the most common conditions that affect postmenopausal women, such as heart disease, cancer, and bone loss (osteoporosis). Adopting a healthy lifestyle and getting preventive care can help to promote your health and wellness. Those actions can also lower your chances of developing some of these common conditions. WHAT SHOULD I KNOW ABOUT MENOPAUSE? During menopause, you may experience a number of symptoms, such as:  Moderate-to-severe hot flashes.  Night sweats.  Decrease in sex drive.  Mood swings.  Headaches.  Tiredness.  Irritability.  Memory problems.  Insomnia. Choosing to treat or not to treat menopausal changes is an individual decision that you make with your health care provider. WHAT SHOULD I KNOW ABOUT HORMONE REPLACEMENT THERAPY AND SUPPLEMENTS? Hormone therapy products are effective for treating symptoms that are associated with menopause, such as hot flashes and night sweats. Hormone replacement carries certain risks, especially as you become older. If you are thinking about using estrogen or estrogen with progestin treatments, discuss the benefits and risks with your health care provider. WHAT SHOULD I KNOW ABOUT HEART  DISEASE AND STROKE? Heart disease, heart attack, and stroke become more likely as you age. This may be due, in part, to the hormonal changes that your body experiences during menopause. These can affect how your body processes dietary fats, triglycerides, and cholesterol. Heart attack and stroke are both medical emergencies. There are many things that you can do to help prevent heart disease and stroke:  Have your blood pressure checked at least every 1-2 years. High blood pressure causes heart disease and increases the risk of stroke.  If you are 18-23 years old, ask your health care provider if you should take aspirin to prevent a heart attack or a stroke.  Do not use any tobacco products, including cigarettes, chewing tobacco, or electronic cigarettes. If you need help quitting, ask your health care provider.  It is important to eat a healthy diet and maintain a healthy weight.  Be sure to include plenty of vegetables, fruits, low-fat dairy products,  and lean protein.  Avoid eating foods that are high in solid fats, added sugars, or salt (sodium).  Get regular exercise. This is one of the most important things that you can do for your health.  Try to exercise for at least 150 minutes each week. The type of exercise that you do should increase your heart rate and make you sweat. This is known as moderate-intensity exercise.  Try to do strengthening exercises at least twice each week. Do these in addition to the moderate-intensity exercise.  Know your numbers.Ask your health care provider to check your cholesterol and your blood glucose. Continue to have your blood tested as directed by your health care provider. WHAT SHOULD I KNOW ABOUT CANCER SCREENING? There are several types of cancer. Take the following steps to reduce your risk and to catch any cancer development as early as possible. Breast Cancer  Practice breast self-awareness.  This means understanding how your breasts  normally appear and feel.  It also means doing regular breast self-exams. Let your health care provider know about any changes, no matter how small.  If you are 6 or older, have a clinician do a breast exam (clinical breast exam or CBE) every year. Depending on your age, family history, and medical history, it may be recommended that you also have a yearly breast X-ray (mammogram).  If you have a family history of breast cancer, talk with your health care provider about genetic screening.  If you are at high risk for breast cancer, talk with your health care provider about having an MRI and a mammogram every year.  Breast cancer (BRCA) gene test is recommended for women who have family members with BRCA-related cancers. Results of the assessment will determine the need for genetic counseling and BRCA1 and for BRCA2 testing. BRCA-related cancers include these types:  Breast. This occurs in males or females.  Ovarian.  Tubal. This may also be called fallopian tube cancer.  Cancer of the abdominal or pelvic lining (peritoneal cancer).  Prostate.  Pancreatic. Cervical, Uterine, and Ovarian Cancer Your health care provider may recommend that you be screened regularly for cancer of the pelvic organs. These include your ovaries, uterus, and vagina. This screening involves a pelvic exam, which includes checking for microscopic changes to the surface of your cervix (Pap test).  For women ages 21-65, health care providers may recommend a pelvic exam and a Pap test every three years. For women ages 23-65, they may recommend the Pap test and pelvic exam, combined with testing for human papilloma virus (HPV), every five years. Some types of HPV increase your risk of cervical cancer. Testing for HPV may also be done on women of any age who have unclear Pap test results.  Other health care providers may not recommend any screening for nonpregnant women who are considered low risk for pelvic cancer and  have no symptoms. Ask your health care provider if a screening pelvic exam is right for you.  If you have had past treatment for cervical cancer or a condition that could lead to cancer, you need Pap tests and screening for cancer for at least 20 years after your treatment. If Pap tests have been discontinued for you, your risk factors (such as having a new sexual partner) need to be reassessed to determine if you should start having screenings again. Some women have medical problems that increase the chance of getting cervical cancer. In these cases, your health care provider may recommend that you have screening  and Pap tests more often.  If you have a family history of uterine cancer or ovarian cancer, talk with your health care provider about genetic screening.  If you have vaginal bleeding after reaching menopause, tell your health care provider.  There are currently no reliable tests available to screen for ovarian cancer. Lung Cancer Lung cancer screening is recommended for adults 16-68 years old who are at high risk for lung cancer because of a history of smoking. A yearly low-dose CT scan of the lungs is recommended if you:  Currently smoke.  Have a history of at least 30 pack-years of smoking and you currently smoke or have quit within the past 15 years. A pack-year is smoking an average of one pack of cigarettes per day for one year. Yearly screening should:  Continue until it has been 15 years since you quit.  Stop if you develop a health problem that would prevent you from having lung cancer treatment. Colorectal Cancer  This type of cancer can be detected and can often be prevented.  Routine colorectal cancer screening usually begins at age 39 and continues through age 67.  If you have risk factors for colon cancer, your health care provider may recommend that you be screened at an earlier age.  If you have a family history of colorectal cancer, talk with your health care  provider about genetic screening.  Your health care provider may also recommend using home test kits to check for hidden blood in your stool.  A small camera at the end of a tube can be used to examine your colon directly (sigmoidoscopy or colonoscopy). This is done to check for the earliest forms of colorectal cancer.  Direct examination of the colon should be repeated every 5-10 years until age 25. However, if early forms of precancerous polyps or small growths are found or if you have a family history or genetic risk for colorectal cancer, you may need to be screened more often. Skin Cancer  Check your skin from head to toe regularly.  Monitor any moles. Be sure to tell your health care provider:  About any new moles or changes in moles, especially if there is a change in a mole's shape or color.  If you have a mole that is larger than the size of a pencil eraser.  If any of your family members has a history of skin cancer, especially at a young age, talk with your health care provider about genetic screening.  Always use sunscreen. Apply sunscreen liberally and repeatedly throughout the day.  Whenever you are outside, protect yourself by wearing long sleeves, pants, a wide-brimmed hat, and sunglasses. WHAT SHOULD I KNOW ABOUT OSTEOPOROSIS? Osteoporosis is a condition in which bone destruction happens more quickly than new bone creation. After menopause, you may be at an increased risk for osteoporosis. To help prevent osteoporosis or the bone fractures that can happen because of osteoporosis, the following is recommended:  If you are 82-28 years old, get at least 1,000 mg of calcium and at least 600 mg of vitamin D per day.  If you are older than age 4 but younger than age 64, get at least 1,200 mg of calcium and at least 600 mg of vitamin D per day.  If you are older than age 73, get at least 1,200 mg of calcium and at least 800 mg of vitamin D per day. Smoking and excessive  alcohol intake increase the risk of osteoporosis. Eat foods that are rich  in calcium and vitamin D, and do weight-bearing exercises several times each week as directed by your health care provider. WHAT SHOULD I KNOW ABOUT HOW MENOPAUSE AFFECTS [place]? Depression may occur at any age, but it is more common as you become older. Common symptoms of depression include:  Low or sad mood.  Changes in sleep patterns.  Changes in appetite or eating patterns.  Feeling an overall lack of motivation or enjoyment of activities that you previously enjoyed.  Frequent crying spells. Talk with your health care provider if you think that you are experiencing depression. WHAT SHOULD I KNOW ABOUT IMMUNIZATIONS? It is important that you get and maintain your immunizations. These include:  Tetanus, diphtheria, and pertussis (Tdap) booster vaccine.  Influenza every year before the flu season begins.  Pneumonia vaccine.  Shingles vaccine. Your health care provider may also recommend other immunizations.   This information is not intended to replace advice given to you by your health care provider. Make sure you discuss any questions you have with your health care provider.   Document Released: 12/19/2005 Document Revised: 11/17/2014 Document Reviewed: 06/29/2014 Elsevier Interactive Patient Education [employer].

## 2016-05-10 LAB — GC/CHLAMYDIA PROBE AMP
CT Probe RNA: NOT DETECTED
GC Probe RNA: NOT DETECTED

## 2016-05-29 ENCOUNTER — Ambulatory Visit (INDEPENDENT_AMBULATORY_CARE_PROVIDER_SITE_OTHER): Payer: Managed Care, Other (non HMO)

## 2016-05-29 DIAGNOSIS — Z13820 Encounter for screening for osteoporosis: Secondary | ICD-10-CM | POA: Diagnosis not present

## 2016-05-29 DIAGNOSIS — M899 Disorder of bone, unspecified: Secondary | ICD-10-CM

## 2016-05-29 DIAGNOSIS — M8580 Other specified disorders of bone density and structure, unspecified site: Secondary | ICD-10-CM

## 2016-08-01 ENCOUNTER — Encounter: Payer: Self-pay | Admitting: Gastroenterology

## 2016-10-27 ENCOUNTER — Encounter: Payer: Managed Care, Other (non HMO) | Admitting: Gastroenterology

## 2016-11-21 ENCOUNTER — Ambulatory Visit (AMBULATORY_SURGERY_CENTER): Payer: Self-pay | Admitting: *Deleted

## 2016-11-21 DIAGNOSIS — Z1211 Encounter for screening for malignant neoplasm of colon: Secondary | ICD-10-CM

## 2016-11-21 MED ORDER — NA SULFATE-K SULFATE-MG SULF 17.5-3.13-1.6 GM/177ML PO SOLN
1.0000 | Freq: Once | ORAL | 0 refills | Status: AC
Start: 2016-11-21 — End: 2016-11-21

## 2016-11-21 NOTE — Progress Notes (Signed)
No allergies to eggs or soy. No problems with anesthesia.  Pt given Emmi instructions for colonoscopy  No oxygen use  No diet drug use  

## 2016-12-12 ENCOUNTER — Encounter: Payer: Self-pay | Admitting: Gastroenterology

## 2016-12-12 ENCOUNTER — Ambulatory Visit (AMBULATORY_SURGERY_CENTER): Payer: Managed Care, Other (non HMO) | Admitting: Gastroenterology

## 2016-12-12 VITALS — BP 110/76 | HR 74 | Temp 97.3°F | Resp 22 | Ht 62.0 in | Wt 191.0 lb

## 2016-12-12 DIAGNOSIS — Z1212 Encounter for screening for malignant neoplasm of rectum: Secondary | ICD-10-CM

## 2016-12-12 DIAGNOSIS — Z1211 Encounter for screening for malignant neoplasm of colon: Secondary | ICD-10-CM | POA: Diagnosis not present

## 2016-12-12 DIAGNOSIS — K633 Ulcer of intestine: Secondary | ICD-10-CM | POA: Diagnosis not present

## 2016-12-12 DIAGNOSIS — D122 Benign neoplasm of ascending colon: Secondary | ICD-10-CM | POA: Diagnosis not present

## 2016-12-12 MED ORDER — SODIUM CHLORIDE 0.9 % IV SOLN: 500.0000 mL | INTRAVENOUS | Status: DC

## 2016-12-12 NOTE — Op Note (Signed)
[facility] Patient Name: Heather Taylor Procedure Date: 12/12/2016 8:02 AM MRN: [id number] Endoscopist: [place]. Loletha Carrow , MD Age: 66 Referring MD:  Date of Birth: September 30, 1951 Gender: Female Account #: [id number] Procedure:                Colonoscopy Indications:              Screening for colorectal malignant neoplasm (no                            polyps on 2007 colonoscopy) Medicines:                Monitored Anesthesia Care Procedure:                Pre-Anesthesia Assessment:                           - Prior to the procedure, a History and Physical                            was performed, and patient medications and                            allergies were reviewed. The patient's tolerance of                            previous anesthesia was also reviewed. The risks                            and benefits of the procedure and the sedation                            options and risks were discussed with the patient.                            All questions were answered, and informed consent                            was obtained. Anticoagulants: The patient has taken                            aspirin. It was decided not to withhold this                            medication prior to the procedure. ASA Grade                            Assessment: II - A patient with mild systemic                            disease. After reviewing the risks and benefits,                            the patient was deemed in satisfactory condition to  undergo the procedure.                           After obtaining informed consent, the colonoscope                            was passed under direct vision. Throughout the                            procedure, the patient's blood pressure, pulse, and                            oxygen saturations were monitored continuously. The                            Model CF-HQ190L [id number]) scope was introduced                     through the anus and advanced to the the cecum,                            identified by appendiceal orifice and ileocecal                            valve. The colonoscopy was performed without                            difficulty. The patient tolerated the procedure                            well. The quality of the bowel preparation was                            excellent. The ileocecal valve, appendiceal                            orifice, and rectum were photographed. The quality                            of the bowel preparation was evaluated using the                            BBPS [facility] Bowel Preparation Scale) with scores                            of: Right Colon = 3, Transverse Colon = 3 and Left                            Colon = 2. The total BBPS score equals 8. The bowel                            preparation used was SUPREP. Scope In: 8:06:29 AM Scope Out: 8:23:17 AM Scope Withdrawal Time: 0 hours 10 minutes 48 seconds  Total Procedure Duration: 0 hours 16 minutes 48  seconds  Findings:                 The digital rectal exam findings include decreased                            sphincter tone.                           A 8 mm polyp was found in the mid ascending colon.                            The polyp was sessile. The polyp was removed with a                            cold snare. Resection and retrieval were complete.                           A single (solitary) 20 mm linear, benign-appearing                            ulcer was found at the splenic flexure. No bleeding                            was present. No stigmata of recent bleeding were                            seen. Biopsies were taken with a cold forceps for                            histology.                           Multiple medium-mouthed diverticula were found in                            the distal ascending colon and left colon.                           Retroflexion in  the rectum was not performed due to                            anatomy.                           The exam was otherwise without abnormality. Complications:            No immediate complications. Estimated Blood Loss:     Estimated blood loss: none. Impression:               - Decreased sphincter tone found on digital rectal                            exam.                           - One 8 mm  polyp in the mid ascending colon,                            removed with a cold snare. Resected and retrieved.                           - A single (solitary) ulcer at the splenic flexure.                            Biopsied. Appears most likely to be aspirin-induced.                           - Diverticulosis in the distal ascending colon and                            in the left colon.                           - The examination was otherwise normal. Recommendation:           - Patient has a contact number available for                            emergencies. The signs and symptoms of potential                            delayed complications were discussed with the                            patient. Return to normal activities tomorrow.                            Written discharge instructions were provided to the                            patient.                           - Resume previous diet.                           - Discontinue aspirin and NSAIDs for 2 months, then                            patient should consult with primary care about how                            strongly aspirin is indicated in the future.                           - Await pathology results.                           - Repeat colonoscopy is recommended for  surveillance. The colonoscopy date will be                            determined after pathology results from today's                            exam become available for review. Henry L. Loletha Carrow, MD 12/12/2016 8:30:21 AM This report  has been signed electronically.

## 2016-12-12 NOTE — Progress Notes (Signed)
Called to room to assist during endoscopic procedure.  Patient ID and intended procedure confirmed with present staff. Received instructions for my participation in the procedure from the performing physician.  

## 2016-12-12 NOTE — Progress Notes (Signed)
To recovery, report to [place], RN, VSS 

## 2016-12-12 NOTE — Patient Instructions (Signed)
YOU HAD AN ENDOSCOPIC PROCEDURE TODAY AT [facility]:   Refer to the procedure report that was given to you for any specific questions about what was found during the examination.  If the procedure report does not answer your questions, please call your gastroenterologist to clarify.  If you requested that your care partner not be given the details of your procedure findings, then the procedure report has been included in a sealed envelope for you to review at your convenience later.  YOU SHOULD EXPECT: Some feelings of bloating in the abdomen. Passage of more gas than usual.  Walking can help get rid of the air that was put into your GI tract during the procedure and reduce the bloating. If you had a lower endoscopy (such as a colonoscopy or flexible sigmoidoscopy) you may notice spotting of blood in your stool or on the toilet paper. If you underwent a bowel prep for your procedure, you may not have a normal bowel movement for a few days.  Please Note:  You might notice some irritation and congestion in your nose or some drainage.  This is from the oxygen used during your procedure.  There is no need for concern and it should clear up in a day or so.  SYMPTOMS TO REPORT IMMEDIATELY:   Following lower endoscopy (colonoscopy or flexible sigmoidoscopy):  Excessive amounts of blood in the stool  Significant tenderness or worsening of abdominal pains  Swelling of the abdomen that is new, acute  Fever of 100F or higher   For urgent or emergent issues, a gastroenterologist can be reached at any hour by calling [phone].   DIET:  We do recommend a small meal at first, but then you may proceed to your regular diet.  Drink plenty of fluids but you should avoid alcoholic beverages for 24 hours. Try to increase the fiber in your diet, and drink plenty of water.  ACTIVITY:  You should plan to take it easy for the rest of today and you should NOT DRIVE or use heavy machinery until  tomorrow (because of the sedation medicines used during the test).    FOLLOW UP: Our staff will call the number listed on your records the next business day following your procedure to check on you and address any questions or concerns that you may have regarding the information given to you following your procedure. If we do not reach you, we will leave a message.  However, if you are feeling well and you are not experiencing any problems, there is no need to return our call.  We will assume that you have returned to your regular daily activities without incident.  If any biopsies were taken you will be contacted by phone or by letter within the next 1-3 weeks.  Please call us at [phone] if you have not heard about the biopsies in 3 weeks.    SIGNATURES/CONFIDENTIALITY: You and/or your care partner have signed paperwork which will be entered into your electronic medical record.  These signatures attest to the fact that that the information above on your After Visit Summary has been reviewed and is understood.  Full responsibility of the confidentiality of this discharge information lies with you and/or your care-partner.  Do not use NSAIDS: aspirin, aleve, ibuprofen for 2 months per Dr. Loletha Carrow.  Thank-you for choosing [place] for your healthcare needs today.

## 2016-12-15 ENCOUNTER — Telehealth: Payer: Self-pay

## 2016-12-15 NOTE — Telephone Encounter (Signed)
  Follow up Call-  Call back number 12/12/2016  Post procedure Call Back phone  # [phone]  Permission to leave phone message Yes  Some recent data might be hidden    Patient was called for follow up after her procedure on 12/12/2016. No answer at the number given for follow up phone call. A message was left on the answering machine.

## 2016-12-15 NOTE — Telephone Encounter (Signed)
  Follow up Call-  Call back number 12/12/2016  Post procedure Call Back phone  # [phone]  Permission to leave phone message Yes  Some recent data might be hidden    Patient was called for follow up after her procedure on 12/12/2016. I spoke with the patients mother and she reports that Heather Taylor has returned to her normal daily activities without any complications.

## 2016-12-17 ENCOUNTER — Encounter: Payer: Self-pay | Admitting: Gastroenterology

## 2017-02-26 ENCOUNTER — Other Ambulatory Visit: Payer: Self-pay | Admitting: Internal Medicine

## 2017-02-26 DIAGNOSIS — Z1231 Encounter for screening mammogram for malignant neoplasm of breast: Secondary | ICD-10-CM

## 2017-03-25 ENCOUNTER — Encounter: Payer: Self-pay | Admitting: Gynecology

## 2017-04-10 ENCOUNTER — Ambulatory Visit
Admission: RE | Admit: 2017-04-10 | Discharge: 2017-04-10 | Disposition: A | Discharge status: Home / Self Care | Payer: Managed Care, Other (non HMO) | Source: Ambulatory Visit | Attending: Internal Medicine | Admitting: Internal Medicine

## 2017-04-10 DIAGNOSIS — Z1231 Encounter for screening mammogram for malignant neoplasm of breast: Secondary | ICD-10-CM

## 2017-04-17 ENCOUNTER — Ambulatory Visit: Payer: Managed Care, Other (non HMO) | Admitting: Podiatry

## 2017-04-24 ENCOUNTER — Ambulatory Visit (INDEPENDENT_AMBULATORY_CARE_PROVIDER_SITE_OTHER): Payer: Managed Care, Other (non HMO) | Admitting: Podiatry

## 2017-04-24 ENCOUNTER — Encounter: Payer: Self-pay | Admitting: Podiatry

## 2017-04-24 DIAGNOSIS — B351 Tinea unguium: Secondary | ICD-10-CM

## 2017-04-24 DIAGNOSIS — L608 Other nail disorders: Secondary | ICD-10-CM

## 2017-04-24 NOTE — Progress Notes (Signed)
   Subjective:    Patient ID: Leiah Giannotti, female    DOB: 07-26-1951, 66 y.o.   MRN: [id number]  HPI  Chief Complaint  Patient presents with  . Nail Problem    BL- Nails discolored/thick   66 year old female presents the office today for concerns of thick, discolored toenails to both of her big toenails. She denies any swelling redness or any drainage. She states that she has no pain to the toenails. She said no treatment for this and his been ongoing for couple years at this time. She has no other concerns.    Review of Systems  All other systems reviewed and are negative.      Objective:   Physical Exam General: AAO x3, NAD  Dermatological: Bilateral hallux toenails are dystrophic, discolored yellow to brown discoloration. Is no pain to the toenail is no swelling redness or drainage. No open lesions are identified.  Vascular: Dorsalis Pedis artery and Posterior Tibial artery pedal pulses are 2/4 bilateral with immedate capillary fill time. There is no pain with calf compression, swelling, warmth, erythema.   Neruologic: Grossly intact via light touch bilateral. Vibratory intact via tuning fork bilateral. Protective threshold with Semmes Wienstein monofilament intact to all pedal sites bilateral.   Musculoskeletal: No gross boney pedal deformities bilateral. No pain, crepitus, or limitation noted with foot and ankle range of motion bilateral. Muscular strength 5/5 in all groups tested bilateral.  Gait: Unassisted, Nonantalgic.      Assessment & Plan:  66 year old female with bilateral hallux onychodystrophy, likely onychomycosis -Treatment options discussed including all alternatives, risks, and complications -Etiology of symptoms were discussed -Discussed treatment options. After discussion in regards the option she's not to proceed with topical treatment. I ordered a compound cream for onychomycosis. This is ordered through [employer].  -Discussed other measures in regards  to changing shoes and socks. -Follow-up as needed. Encouraged to call any questions or concerns or any change in symptoms.  Celesta Gentile, DPM

## 2017-05-15 ENCOUNTER — Encounter: Payer: Managed Care, Other (non HMO) | Admitting: Women's Health

## 2017-05-20 ENCOUNTER — Encounter: Payer: Managed Care, Other (non HMO) | Admitting: Women's Health

## 2017-05-25 ENCOUNTER — Encounter: Payer: Self-pay | Admitting: Women's Health

## 2017-05-25 ENCOUNTER — Ambulatory Visit (INDEPENDENT_AMBULATORY_CARE_PROVIDER_SITE_OTHER): Payer: Managed Care, Other (non HMO) | Admitting: Women's Health

## 2017-05-25 VITALS — BP 122/78 | Ht 61.0 in | Wt 188.0 lb

## 2017-05-25 DIAGNOSIS — Z01419 Encounter for gynecological examination (general) (routine) without abnormal findings: Secondary | ICD-10-CM | POA: Diagnosis not present

## 2017-05-25 NOTE — Patient Instructions (Signed)
Health Maintenance for Postmenopausal Women Menopause is a normal process in which your reproductive ability comes to an end. This process happens gradually over a span of months to years, usually between the ages of 22 and 9. Menopause is complete when you have missed 12 consecutive menstrual periods. It is important to talk with your health care provider about some of the most common conditions that affect postmenopausal women, such as heart disease, cancer, and bone loss (osteoporosis). Adopting a healthy lifestyle and getting preventive care can help to promote your health and wellness. Those actions can also lower your chances of developing some of these common conditions. What should I know about menopause? During menopause, you may experience a number of symptoms, such as:  Moderate-to-severe hot flashes.  Night sweats.  Decrease in sex drive.  Mood swings.  Headaches.  Tiredness.  Irritability.  Memory problems.  Insomnia.  Choosing to treat or not to treat menopausal changes is an individual decision that you make with your health care provider. What should I know about hormone replacement therapy and supplements? Hormone therapy products are effective for treating symptoms that are associated with menopause, such as hot flashes and night sweats. Hormone replacement carries certain risks, especially as you become older. If you are thinking about using estrogen or estrogen with progestin treatments, discuss the benefits and risks with your health care provider. What should I know about heart disease and stroke? Heart disease, heart attack, and stroke become more likely as you age. This may be due, in part, to the hormonal changes that your body experiences during menopause. These can affect how your body processes dietary fats, triglycerides, and cholesterol. Heart attack and stroke are both medical emergencies. There are many things that you can do to help prevent heart disease  and stroke:  Have your blood pressure checked at least every 1-2 years. High blood pressure causes heart disease and increases the risk of stroke.  If you are 53-22 years old, ask your health care provider if you should take aspirin to prevent a heart attack or a stroke.  Do not use any tobacco products, including cigarettes, chewing tobacco, or electronic cigarettes. If you need help quitting, ask your health care provider.  It is important to eat a healthy diet and maintain a healthy weight. ? Be sure to include plenty of vegetables, fruits, low-fat dairy products, and lean protein. ? Avoid eating foods that are high in solid fats, added sugars, or salt (sodium).  Get regular exercise. This is one of the most important things that you can do for your health. ? Try to exercise for at least 150 minutes each week. The type of exercise that you do should increase your heart rate and make you sweat. This is known as moderate-intensity exercise. ? Try to do strengthening exercises at least twice each week. Do these in addition to the moderate-intensity exercise.  Know your numbers.Ask your health care provider to check your cholesterol and your blood glucose. Continue to have your blood tested as directed by your health care provider.  What should I know about cancer screening? There are several types of cancer. Take the following steps to reduce your risk and to catch any cancer development as early as possible. Breast Cancer  Practice breast self-awareness. ? This means understanding how your breasts normally appear and feel. ? It also means doing regular breast self-exams. Let your health care provider know about any changes, no matter how small.  If you are 40  or older, have a clinician do a breast exam (clinical breast exam or CBE) every year. Depending on your age, family history, and medical history, it may be recommended that you also have a yearly breast X-ray (mammogram).  If you  have a family history of breast cancer, talk with your health care provider about genetic screening.  If you are at high risk for breast cancer, talk with your health care provider about having an MRI and a mammogram every year.  Breast cancer (BRCA) gene test is recommended for women who have family members with BRCA-related cancers. Results of the assessment will determine the need for genetic counseling and BRCA1 and for BRCA2 testing. BRCA-related cancers include these types: ? Breast. This occurs in males or females. ? Ovarian. ? Tubal. This may also be called fallopian tube cancer. ? Cancer of the abdominal or pelvic lining (peritoneal cancer). ? Prostate. ? Pancreatic.  Cervical, Uterine, and Ovarian Cancer Your health care provider may recommend that you be screened regularly for cancer of the pelvic organs. These include your ovaries, uterus, and vagina. This screening involves a pelvic exam, which includes checking for microscopic changes to the surface of your cervix (Pap test).  For women ages 21-65, health care providers may recommend a pelvic exam and a Pap test every three years. For women ages 79-65, they may recommend the Pap test and pelvic exam, combined with testing for human papilloma virus (HPV), every five years. Some types of HPV increase your risk of cervical cancer. Testing for HPV may also be done on women of any age who have unclear Pap test results.  Other health care providers may not recommend any screening for nonpregnant women who are considered low risk for pelvic cancer and have no symptoms. Ask your health care provider if a screening pelvic exam is right for you.  If you have had past treatment for cervical cancer or a condition that could lead to cancer, you need Pap tests and screening for cancer for at least 20 years after your treatment. If Pap tests have been discontinued for you, your risk factors (such as having a new sexual partner) need to be  reassessed to determine if you should start having screenings again. Some women have medical problems that increase the chance of getting cervical cancer. In these cases, your health care provider may recommend that you have screening and Pap tests more often.  If you have a family history of uterine cancer or ovarian cancer, talk with your health care provider about genetic screening.  If you have vaginal bleeding after reaching menopause, tell your health care provider.  There are currently no reliable tests available to screen for ovarian cancer.  Lung Cancer Lung cancer screening is recommended for adults 69-62 years old who are at high risk for lung cancer because of a history of smoking. A yearly low-dose CT scan of the lungs is recommended if you:  Currently smoke.  Have a history of at least 30 pack-years of smoking and you currently smoke or have quit within the past 15 years. A pack-year is smoking an average of one pack of cigarettes per day for one year.  Yearly screening should:  Continue until it has been 15 years since you quit.  Stop if you develop a health problem that would prevent you from having lung cancer treatment.  Colorectal Cancer  This type of cancer can be detected and can often be prevented.  Routine colorectal cancer screening usually begins at  age 42 and continues through age 45.  If you have risk factors for colon cancer, your health care provider may recommend that you be screened at an earlier age.  If you have a family history of colorectal cancer, talk with your health care provider about genetic screening.  Your health care provider may also recommend using home test kits to check for hidden blood in your stool.  A small camera at the end of a tube can be used to examine your colon directly (sigmoidoscopy or colonoscopy). This is done to check for the earliest forms of colorectal cancer.  Direct examination of the colon should be repeated every  5-10 years until age 71. However, if early forms of precancerous polyps or small growths are found or if you have a family history or genetic risk for colorectal cancer, you may need to be screened more often.  Skin Cancer  Check your skin from head to toe regularly.  Monitor any moles. Be sure to tell your health care provider: ? About any new moles or changes in moles, especially if there is a change in a mole's shape or color. ? If you have a mole that is larger than the size of a pencil eraser.  If any of your family members has a history of skin cancer, especially at a Shenia Alan age, talk with your health care provider about genetic screening.  Always use sunscreen. Apply sunscreen liberally and repeatedly throughout the day.  Whenever you are outside, protect yourself by wearing long sleeves, pants, a wide-brimmed hat, and sunglasses.  What should I know about osteoporosis? Osteoporosis is a condition in which bone destruction happens more quickly than new bone creation. After menopause, you may be at an increased risk for osteoporosis. To help prevent osteoporosis or the bone fractures that can happen because of osteoporosis, the following is recommended:  If you are 46-71 years old, get at least 1,000 mg of calcium and at least 600 mg of vitamin D per day.  If you are older than age 55 but younger than age 65, get at least 1,200 mg of calcium and at least 600 mg of vitamin D per day.  If you are older than age 54, get at least 1,200 mg of calcium and at least 800 mg of vitamin D per day.  Smoking and excessive alcohol intake increase the risk of osteoporosis. Eat foods that are rich in calcium and vitamin D, and do weight-bearing exercises several times each week as directed by your health care provider. What should I know about how menopause affects my mental health? Depression may occur at any age, but it is more common as you become older. Common symptoms of depression  include:  Low or sad mood.  Changes in sleep patterns.  Changes in appetite or eating patterns.  Feeling an overall lack of motivation or enjoyment of activities that you previously enjoyed.  Frequent crying spells.  Talk with your health care provider if you think that you are experiencing depression. What should I know about immunizations? It is important that you get and maintain your immunizations. These include:  Tetanus, diphtheria, and pertussis (Tdap) booster vaccine.  Influenza every year before the flu season begins.  Pneumonia vaccine.  Shingles vaccine.  Your health care provider may also recommend other immunizations. This information is not intended to replace advice given to you by your health care provider. Make sure you discuss any questions you have with your health care provider. Document Released: 12/19/2005  Document Revised: 05/16/2016 Document Reviewed: 07/31/2015 Elsevier Interactive Patient Education  2018 [employer] Inc.  

## 2017-05-25 NOTE — Addendum Note (Signed)
Addended by: Joaquin Music on: 05/25/2017 03:15 PM   Modules accepted: Orders

## 2017-05-25 NOTE — Progress Notes (Signed)
Heather Taylor 10-17-1951 [id number]    History:    Presents for annual exam.  1985 TAH for fibroids on no HRT. Same partner. 2017 T score negative 1.7 without elevated FRAX. Normal Pap and mammogram history. 12/2016 benign colon polyp 5 yr. follow-up. Has not had Zostavax.  Past medical history, past surgical history, [name] Works in a factory. Mother hypothyroid and hypercholesterolemia  ROS:  A ROS was performed and pertinent positives and negatives are included.  Exam:  Vitals:   05/25/17 1151  BP: 122/78  Weight: 188 lb (85.3 kg)  Height: 5\' 1"  (1.549 m)   Body mass index is 35.52 kg/m.   General appearance:  Normal Thyroid:  Symmetrical, normal in size, without palpable masses or nodularity. Respiratory  Auscultation:  Clear without wheezing or rhonchi Cardiovascular  Auscultation:  Regular rate, without rubs, murmurs or gallops  Edema/varicosities:  Bilateral ankle edema-since high school Abdominal  Soft,nontender, without masses, guarding or rebound.  Liver/spleen:  No organomegaly noted  Hernia:  None appreciated  Skin  Inspection:  Grossly normal   Breasts: Examined lying and sitting.     Right: Without masses, retractions, discharge or axillary adenopathy.     Left: Without masses, retractions, discharge or axillary adenopathy. Gentitourinary   Inguinal/mons:  Normal without inguinal adenopathy  External genitalia:  Normal  BUS/Urethra/Skene's glands:  Normal  Vagina:  Normal  Cervix: Uterus absent  Adnexa/parametria:     Rt: Without masses or tenderness.   Lt: Without masses or tenderness.  Anus and perineum: Normal  Digital rectal exam: Normal sphincter tone without palpated masses or tenderness  Assessment/Plan:  66 y.o. SBF G0 for annual exam with no complaints.  1985 TAH for fibroids on no HRT 12/2016 benign colon polyps/ 5 yr  follow-up Osteopenia without elevated  FRAX Arthritis and GERD-Primary care manages labs and meds  Plan: SBE's, continue annual screening mammogram, calcium rich diet, vitamin D 2000 daily encouraged. Reviewed importance of weightbearing exercise, home safety and fall prevention discussed. Zostavax reviewed and encouraged will get an annual exam at primary care in September. Pap screening reviewed . Encouraged to decrease snack foods, decrease carbs in diet for weight loss.    [place], 12:24 PM 05/25/2017

## 2018-03-19 ENCOUNTER — Other Ambulatory Visit: Payer: Self-pay | Admitting: Women's Health

## 2018-03-19 DIAGNOSIS — Z1231 Encounter for screening mammogram for malignant neoplasm of breast: Secondary | ICD-10-CM

## 2018-04-16 ENCOUNTER — Ambulatory Visit
Admission: RE | Admit: 2018-04-16 | Discharge: 2018-04-16 | Disposition: A | Discharge status: Home / Self Care | Payer: Managed Care, Other (non HMO) | Source: Ambulatory Visit | Attending: Women's Health | Admitting: Women's Health

## 2018-04-16 DIAGNOSIS — Z1231 Encounter for screening mammogram for malignant neoplasm of breast: Secondary | ICD-10-CM

## 2018-07-26 ENCOUNTER — Encounter: Payer: Self-pay | Admitting: Women's Health

## 2018-07-26 ENCOUNTER — Ambulatory Visit (INDEPENDENT_AMBULATORY_CARE_PROVIDER_SITE_OTHER): Payer: Managed Care, Other (non HMO) | Admitting: Women's Health

## 2018-07-26 VITALS — BP 120/82 | Ht 61.0 in | Wt 194.0 lb

## 2018-07-26 DIAGNOSIS — Z01419 Encounter for gynecological examination (general) (routine) without abnormal findings: Secondary | ICD-10-CM

## 2018-07-26 DIAGNOSIS — Z13820 Encounter for screening for osteoporosis: Secondary | ICD-10-CM | POA: Diagnosis not present

## 2018-07-26 NOTE — Patient Instructions (Signed)
Health Maintenance for Postmenopausal Women Menopause is a normal process in which your reproductive ability comes to an end. This process happens gradually over a span of months to years, usually between the ages of 22 and 9. Menopause is complete when you have missed 12 consecutive menstrual periods. It is important to talk with your health care provider about some of the most common conditions that affect postmenopausal women, such as heart disease, cancer, and bone loss (osteoporosis). Adopting a healthy lifestyle and getting preventive care can help to promote your health and wellness. Those actions can also lower your chances of developing some of these common conditions. What should I know about menopause? During menopause, you may experience a number of symptoms, such as:  Moderate-to-severe hot flashes.  Night sweats.  Decrease in sex drive.  Mood swings.  Headaches.  Tiredness.  Irritability.  Memory problems.  Insomnia.  Choosing to treat or not to treat menopausal changes is an individual decision that you make with your health care provider. What should I know about hormone replacement therapy and supplements? Hormone therapy products are effective for treating symptoms that are associated with menopause, such as hot flashes and night sweats. Hormone replacement carries certain risks, especially as you become older. If you are thinking about using estrogen or estrogen with progestin treatments, discuss the benefits and risks with your health care provider. What should I know about heart disease and stroke? Heart disease, heart attack, and stroke become more likely as you age. This may be due, in part, to the hormonal changes that your body experiences during menopause. These can affect how your body processes dietary fats, triglycerides, and cholesterol. Heart attack and stroke are both medical emergencies. There are many things that you can do to help prevent heart disease  and stroke:  Have your blood pressure checked at least every 1-2 years. High blood pressure causes heart disease and increases the risk of stroke.  If you are 53-22 years old, ask your health care provider if you should take aspirin to prevent a heart attack or a stroke.  Do not use any tobacco products, including cigarettes, chewing tobacco, or electronic cigarettes. If you need help quitting, ask your health care provider.  It is important to eat a healthy diet and maintain a healthy weight. ? Be sure to include plenty of vegetables, fruits, low-fat dairy products, and lean protein. ? Avoid eating foods that are high in solid fats, added sugars, or salt (sodium).  Get regular exercise. This is one of the most important things that you can do for your health. ? Try to exercise for at least 150 minutes each week. The type of exercise that you do should increase your heart rate and make you sweat. This is known as moderate-intensity exercise. ? Try to do strengthening exercises at least twice each week. Do these in addition to the moderate-intensity exercise.  Know your numbers.Ask your health care provider to check your cholesterol and your blood glucose. Continue to have your blood tested as directed by your health care provider.  What should I know about cancer screening? There are several types of cancer. Take the following steps to reduce your risk and to catch any cancer development as early as possible. Breast Cancer  Practice breast self-awareness. ? This means understanding how your breasts normally appear and feel. ? It also means doing regular breast self-exams. Let your health care provider know about any changes, no matter how small.  If you are 40  or older, have a clinician do a breast exam (clinical breast exam or CBE) every year. Depending on your age, family history, and medical history, it may be recommended that you also have a yearly breast X-ray (mammogram).  If you  have a family history of breast cancer, talk with your health care provider about genetic screening.  If you are at high risk for breast cancer, talk with your health care provider about having an MRI and a mammogram every year.  Breast cancer (BRCA) gene test is recommended for women who have family members with BRCA-related cancers. Results of the assessment will determine the need for genetic counseling and BRCA1 and for BRCA2 testing. BRCA-related cancers include these types: ? Breast. This occurs in males or females. ? Ovarian. ? Tubal. This may also be called fallopian tube cancer. ? Cancer of the abdominal or pelvic lining (peritoneal cancer). ? Prostate. ? Pancreatic.  Cervical, Uterine, and Ovarian Cancer Your health care provider may recommend that you be screened regularly for cancer of the pelvic organs. These include your ovaries, uterus, and vagina. This screening involves a pelvic exam, which includes checking for microscopic changes to the surface of your cervix (Pap test).  For women ages 21-65, health care providers may recommend a pelvic exam and a Pap test every three years. For women ages 79-65, they may recommend the Pap test and pelvic exam, combined with testing for human papilloma virus (HPV), every five years. Some types of HPV increase your risk of cervical cancer. Testing for HPV may also be done on women of any age who have unclear Pap test results.  Other health care providers may not recommend any screening for nonpregnant women who are considered low risk for pelvic cancer and have no symptoms. Ask your health care provider if a screening pelvic exam is right for you.  If you have had past treatment for cervical cancer or a condition that could lead to cancer, you need Pap tests and screening for cancer for at least 20 years after your treatment. If Pap tests have been discontinued for you, your risk factors (such as having a new sexual partner) need to be  reassessed to determine if you should start having screenings again. Some women have medical problems that increase the chance of getting cervical cancer. In these cases, your health care provider may recommend that you have screening and Pap tests more often.  If you have a family history of uterine cancer or ovarian cancer, talk with your health care provider about genetic screening.  If you have vaginal bleeding after reaching menopause, tell your health care provider.  There are currently no reliable tests available to screen for ovarian cancer.  Lung Cancer Lung cancer screening is recommended for adults 69-62 years old who are at high risk for lung cancer because of a history of smoking. A yearly low-dose CT scan of the lungs is recommended if you:  Currently smoke.  Have a history of at least 30 pack-years of smoking and you currently smoke or have quit within the past 15 years. A pack-year is smoking an average of one pack of cigarettes per day for one year.  Yearly screening should:  Continue until it has been 15 years since you quit.  Stop if you develop a health problem that would prevent you from having lung cancer treatment.  Colorectal Cancer  This type of cancer can be detected and can often be prevented.  Routine colorectal cancer screening usually begins at  age 42 and continues through age 45.  If you have risk factors for colon cancer, your health care provider may recommend that you be screened at an earlier age.  If you have a family history of colorectal cancer, talk with your health care provider about genetic screening.  Your health care provider may also recommend using home test kits to check for hidden blood in your stool.  A small camera at the end of a tube can be used to examine your colon directly (sigmoidoscopy or colonoscopy). This is done to check for the earliest forms of colorectal cancer.  Direct examination of the colon should be repeated every  5-10 years until age 71. However, if early forms of precancerous polyps or small growths are found or if you have a family history or genetic risk for colorectal cancer, you may need to be screened more often.  Skin Cancer  Check your skin from head to toe regularly.  Monitor any moles. Be sure to tell your health care provider: ? About any new moles or changes in moles, especially if there is a change in a mole's shape or color. ? If you have a mole that is larger than the size of a pencil eraser.  If any of your family members has a history of skin cancer, especially at a Caylea Foronda age, talk with your health care provider about genetic screening.  Always use sunscreen. Apply sunscreen liberally and repeatedly throughout the day.  Whenever you are outside, protect yourself by wearing long sleeves, pants, a wide-brimmed hat, and sunglasses.  What should I know about osteoporosis? Osteoporosis is a condition in which bone destruction happens more quickly than new bone creation. After menopause, you may be at an increased risk for osteoporosis. To help prevent osteoporosis or the bone fractures that can happen because of osteoporosis, the following is recommended:  If you are 46-71 years old, get at least 1,000 mg of calcium and at least 600 mg of vitamin D per day.  If you are older than age 55 but younger than age 65, get at least 1,200 mg of calcium and at least 600 mg of vitamin D per day.  If you are older than age 54, get at least 1,200 mg of calcium and at least 800 mg of vitamin D per day.  Smoking and excessive alcohol intake increase the risk of osteoporosis. Eat foods that are rich in calcium and vitamin D, and do weight-bearing exercises several times each week as directed by your health care provider. What should I know about how menopause affects my mental health? Depression may occur at any age, but it is more common as you become older. Common symptoms of depression  include:  Low or sad mood.  Changes in sleep patterns.  Changes in appetite or eating patterns.  Feeling an overall lack of motivation or enjoyment of activities that you previously enjoyed.  Frequent crying spells.  Talk with your health care provider if you think that you are experiencing depression. What should I know about immunizations? It is important that you get and maintain your immunizations. These include:  Tetanus, diphtheria, and pertussis (Tdap) booster vaccine.  Influenza every year before the flu season begins.  Pneumonia vaccine.  Shingles vaccine.  Your health care provider may also recommend other immunizations. This information is not intended to replace advice given to you by your health care provider. Make sure you discuss any questions you have with your health care provider. Document Released: 12/19/2005  Document Revised: 05/16/2016 Document Reviewed: 07/31/2015 Elsevier Interactive Patient Education  2018 [employer]  

## 2018-07-26 NOTE — Progress Notes (Signed)
Heather Taylor 06/20/1951 [id number]    History:    Presents for annual exam.  1985 TAH for fibroids on no HRT.  Normal Pap and mammogram history.  2017 T score -1.7.  2018 benign colon polyp 5-year follow-up.  Primary care manages labs and vaccines.  Past medical history, past surgical history, [name]  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  ROS:  A ROS was performed and pertinent positives and negatives are included.  Exam:  Vitals:   07/26/18 0918  BP: 120/82  Weight: 194 lb (88 kg)  Height: 5\' 1"  (1.549 m)   Body mass index is 36.66 kg/m.   General appearance:  Normal Thyroid:  Symmetrical, normal in size, without palpable masses or nodularity. Respiratory  Auscultation:  Clear without wheezing or rhonchi Cardiovascular  Auscultation:  Regular rate, without rubs, murmurs or gallops  Edema/varicosities:  Not grossly evident Abdominal  Soft,nontender, without masses, guarding or rebound.  Liver/spleen:  No organomegaly noted  Hernia:  None appreciated  Skin  Inspection:  Grossly normal   Breasts: Examined lying and sitting.     Right: Without masses, retractions, discharge or axillary adenopathy.     Left: Without masses, retractions, discharge or axillary adenopathy. Gentitourinary   Inguinal/mons:  Normal without inguinal adenopathy  External genitalia:  Normal  BUS/Urethra/Skene's glands:  Normal  Vagina:  Normal  Cervix: And uterus absent adnexa/parametria:     Rt: Without masses or tenderness.   Lt: Without masses or tenderness.  Anus and perineum: Normal  Digital rectal exam: Normal sphincter tone without palpated masses or tenderness  Assessment/Plan:  67 y.o. SBF G0 for annual exam no complaints.  1985 TAH for fibroids no HRT Osteopenia without elevated FRAX GERD- primary care manages labs and meds Obesity  Plan: SBE's, continue  annual screening mammogram, calcium rich foods, vitamin D 2000 daily encouraged.  Home safety, fall prevention and importance of weightbearing and balance exercise reviewed.  Repeat DEXA will schedule.  Encouraged to decrease calorie/carbs weight watchers encouraged.  [place], 1:43 PM 07/26/2018

## 2018-07-27 LAB — URINALYSIS, COMPLETE W/RFL CULTURE
Bacteria, UA: NONE SEEN /HPF
Bilirubin Urine: NEGATIVE
Glucose, UA: NEGATIVE
HYALINE CAST: NONE SEEN /LPF
Hgb urine dipstick: NEGATIVE
Ketones, ur: NEGATIVE
Leukocyte Esterase: NEGATIVE
NITRITES URINE, INITIAL: NEGATIVE
PROTEIN: NEGATIVE
RBC / HPF: NONE SEEN /HPF (ref 0–2)
SQUAMOUS EPITHELIAL / LPF: NONE SEEN /HPF (ref <=5)
Specific Gravity, Urine: 1.027 (ref 1.001–1.03)
WBC, UA: NONE SEEN /HPF (ref 0–5)
pH: 6 (ref 5.0–8.0)

## 2018-07-27 LAB — NO CULTURE INDICATED

## 2018-08-18 ENCOUNTER — Ambulatory Visit (INDEPENDENT_AMBULATORY_CARE_PROVIDER_SITE_OTHER): Payer: Managed Care, Other (non HMO)

## 2018-08-18 DIAGNOSIS — M8589 Other specified disorders of bone density and structure, multiple sites: Secondary | ICD-10-CM | POA: Diagnosis not present

## 2018-08-18 DIAGNOSIS — Z780 Asymptomatic menopausal state: Secondary | ICD-10-CM

## 2018-08-18 DIAGNOSIS — Z13820 Encounter for screening for osteoporosis: Secondary | ICD-10-CM

## 2018-08-19 ENCOUNTER — Other Ambulatory Visit: Payer: Self-pay | Admitting: Obstetrics & Gynecology

## 2018-08-19 ENCOUNTER — Other Ambulatory Visit: Payer: Self-pay | Admitting: Women's Health

## 2018-08-19 DIAGNOSIS — M8589 Other specified disorders of bone density and structure, multiple sites: Secondary | ICD-10-CM

## 2018-08-19 DIAGNOSIS — Z780 Asymptomatic menopausal state: Secondary | ICD-10-CM

## 2018-08-26 ENCOUNTER — Telehealth: Payer: Self-pay | Admitting: *Deleted

## 2018-08-26 NOTE — Telephone Encounter (Signed)
-----   Message from Princess Bruins, MD sent at 08/22/2018  9:41 PM EDT ----- Regarding: Bone Density results Please inform patient by letter.  Bone Density results:  Osteopenia.  Improved at Spine and Left Hip.  Continue Vit D supplements, Ca++ 1.5 g/day, weightbearing physical activity.  We will repeat a Bone Density in 2 years.

## 2018-08-26 NOTE — Telephone Encounter (Signed)
Left detailed message on voicemail with per DPR access.

## 2019-03-01 ENCOUNTER — Telehealth: Payer: Self-pay

## 2019-03-01 NOTE — Telephone Encounter (Signed)
I contacted the pt and was able to discuss her visit scheduled for 03/18/19.  Pt was advised, due to current COVID 19 pandemic, our office is severely reducing in office visits for at least the next 2 weeks, in order to minimize the risk to our patients and healthcare providers.   Pt was offer a virtual video visit but declined. She states she does not have Internet or a video capable device.   Appt rescheduled for 05/09/19 at 11:30 am

## 2019-03-18 ENCOUNTER — Ambulatory Visit: Payer: Managed Care, Other (non HMO) | Admitting: Neurology

## 2019-03-24 ENCOUNTER — Other Ambulatory Visit: Payer: Self-pay | Admitting: Women's Health

## 2019-03-24 DIAGNOSIS — Z1231 Encounter for screening mammogram for malignant neoplasm of breast: Secondary | ICD-10-CM

## 2019-05-09 ENCOUNTER — Ambulatory Visit (INDEPENDENT_AMBULATORY_CARE_PROVIDER_SITE_OTHER): Payer: Managed Care, Other (non HMO) | Admitting: Neurology

## 2019-05-09 ENCOUNTER — Encounter: Payer: Self-pay | Admitting: Neurology

## 2019-05-09 ENCOUNTER — Other Ambulatory Visit: Payer: Self-pay

## 2019-05-09 VITALS — BP 122/86 | HR 61 | Temp 96.2°F | Ht 61.5 in | Wt 192.3 lb

## 2019-05-09 DIAGNOSIS — M5410 Radiculopathy, site unspecified: Secondary | ICD-10-CM | POA: Diagnosis not present

## 2019-05-09 NOTE — Progress Notes (Signed)
Reason for visit: Right thoracic radiculitis  Referring physician: Dr. Rhea Pink is a 68 y.o. female  History of present illness:  Heather Taylor is a 68 year old right-handed black female with a history of spontaneous onset of right shoulder blade and right chest wall pain that developed in November 2019.  The patient indicates that the pain came on relatively rapidly, there was no evidence of a rash in the area the pain that came around the chest wall to just under the right breast.  The patient did not note any tenderness to light touch of the skin, but she was unable to wear a bra because of the pain.  The patient has had persistence of pain until May 2020, then the pain spontaneously disappeared.  The patient never had any pain down the arm, she denied any neck pain or spine pain and never had any discomfort on the left side.  The patient reported no trouble with the lower extremities without weakness or numbness and no difficulty controlling the bowels or the bladder.  She denied any balance changes.  Currently, she feels back to normal.  She returns for an evaluation.  An x-ray of the thoracic spine was done and did not show severe degenerative changes.  Past Medical History:  Diagnosis Date   Arthritis    Fibroid    GERD (gastroesophageal reflux disease)     Past Surgical History:  Procedure Laterality Date   ABDOMINAL HYSTERECTOMY  1985   TAH    Family History  Problem Relation Age of Onset   Hypertension Father    Heart disease Father    Diabetes Father    Hypertension Brother    Heart disease Brother    Hyperlipidemia Brother    Thyroid disease Mother    Colon cancer Neg Hx     Social history:  reports that she has never smoked. She has never used smokeless tobacco. She reports that she does not drink alcohol or use drugs.  Medications:  Prior to Admission medications   Medication Sig Start Date End Date Taking? Authorizing Provider    aspirin 81 MG tablet Take 325 mg by mouth as needed.    Yes [provider]  Cholecalciferol (VITAMIN D PO) Take 1 tablet by mouth daily.    Yes [provider]  Cyanocobalamin (VITAMIN B 12 PO) Take 1 tablet by mouth daily.    Yes [provider]  esomeprazole (NEXIUM) 20 MG capsule Take 20 mg by mouth daily at 12 noon.   Yes [provider]  Inulin (FIBER CHOICE PO) Take by mouth.   Yes [provider]  Melatonin 5 MG CAPS Take by mouth daily.   Yes [provider]  Multiple Vitamin (MULTIVITAMIN) tablet Take 1 tablet by mouth daily.     Yes [provider]  naproxen (NAPROSYN) 500 MG tablet Take 500 mg by mouth 2 (two) times daily with a meal.   Yes [provider]      Allergies  Allergen Reactions   Penicillins Hives    ROS:  Out of a complete 14 system review of symptoms, the patient complains only of the following symptoms, and all other reviewed systems are negative.  Chest wall pain  Blood pressure 122/86, pulse 61, temperature (!) 96.2 F (35.7 C), temperature source Temporal, height 5' 1.5" (1.562 m), weight 192 lb 5 oz (87.2 kg), last menstrual period 04/10/1984.  Physical Exam  General: The patient is alert and  cooperative at the time of the examination.  The patient is moderately obese.  Eyes: Pupils are equal, round, and reactive to light. Discs are flat bilaterally.  Neck: The neck is supple, no carotid bruits are noted.  Respiratory: The respiratory examination is clear.  Cardiovascular: The cardiovascular examination reveals a regular rate and rhythm, no obvious murmurs or rubs are noted.  Neuromuscular: Range move the low back is full.  Skin: Extremities are without significant edema.  Neurologic Exam  Mental status: The patient is alert and oriented x 3 at the time of the examination. The patient has apparent normal recent and remote memory, with an apparently normal attention  span and concentration ability.  Cranial nerves: Facial symmetry is present. There is good sensation of the face to pinprick and soft touch bilaterally. The strength of the facial muscles and the muscles to head turning and shoulder shrug are normal bilaterally. Speech is well enunciated, no aphasia or dysarthria is noted. Extraocular movements are full. Visual fields are full. The tongue is midline, and the patient has symmetric elevation of the soft palate. No obvious hearing deficits are noted.  Motor: The motor testing reveals 5 over 5 strength of all 4 extremities. Good symmetric motor tone is noted throughout.  Sensory: Sensory testing is intact to pinprick, soft touch, vibration sensation, and position sense on all 4 extremities. No evidence of extinction is noted.  Coordination: Cerebellar testing reveals good finger-nose-finger and heel-to-shin bilaterally.  Gait and station: Gait is normal. Tandem gait is normal. Romberg is negative. No drift is seen.  Reflexes: Deep tendon reflexes are symmetric and normal bilaterally. Toes are downgoing bilaterally.   Assessment/Plan:  1.  Transient right T5 radicular pain  The patient has had pain on the right chest wall that simulates the right T5 level, it is still possible the patient may have had a shingles outbreak without a rash, we will perform further blood work to look for other entities such as Lyme disease or lupus that may produce a vasculitic process.  The patient is feeling back to normal at this point, no further work-up will be done unless abnormalities are noted by blood work.  She will follow-up through this office if needed.  Heather Alexanders MD 05/09/2019 12:10 PM  [facility] [address] [place], [place] [address]  Phone [phone] Fax [phone]

## 2019-05-11 ENCOUNTER — Telehealth: Payer: Self-pay

## 2019-05-11 LAB — ANA W/REFLEX: Anti Nuclear Antibody (ANA): NEGATIVE

## 2019-05-11 LAB — ANGIOTENSIN CONVERTING ENZYME: Angio Convert Enzyme: 56 U/L (ref 14–82)

## 2019-05-11 LAB — B. BURGDORFI ANTIBODIES: Lyme IgG/IgM Ab: <0.91 {ISR} (ref 0.00–0.90)

## 2019-05-11 LAB — SEDIMENTATION RATE: Sed Rate: 24 mm/hr (ref 0–40)

## 2019-05-11 NOTE — Telephone Encounter (Signed)
I reached out and advised on the lab findings to the pt. She verbalized understanding.

## 2019-05-11 NOTE — Telephone Encounter (Signed)
-----   Message from Kathrynn Ducking, MD sent at 05/11/2019 11:58 AM EDT -----  The blood work results are unremarkable. Please call the patient. ----- Message ----- From: Lavone Neri Lab Results In Sent: 05/10/2019   7:37 AM EDT To: Kathrynn Ducking, MD

## 2019-05-16 ENCOUNTER — Other Ambulatory Visit: Payer: Self-pay

## 2019-05-16 ENCOUNTER — Ambulatory Visit
Admission: RE | Admit: 2019-05-16 | Discharge: 2019-05-16 | Disposition: A | Discharge status: Home / Self Care | Payer: Managed Care, Other (non HMO) | Source: Ambulatory Visit | Attending: Women's Health | Admitting: Women's Health

## 2019-05-16 DIAGNOSIS — Z1231 Encounter for screening mammogram for malignant neoplasm of breast: Secondary | ICD-10-CM

## 2019-08-22 ENCOUNTER — Other Ambulatory Visit: Payer: Self-pay

## 2019-08-23 ENCOUNTER — Encounter: Payer: Self-pay | Admitting: Women's Health

## 2019-08-23 ENCOUNTER — Ambulatory Visit (INDEPENDENT_AMBULATORY_CARE_PROVIDER_SITE_OTHER): Payer: Managed Care, Other (non HMO) | Admitting: Women's Health

## 2019-08-23 VITALS — BP 148/80 | Ht 61.0 in | Wt 193.0 lb

## 2019-08-23 DIAGNOSIS — Z01419 Encounter for gynecological examination (general) (routine) without abnormal findings: Secondary | ICD-10-CM | POA: Diagnosis not present

## 2019-08-23 NOTE — Patient Instructions (Addendum)
pnuemovia vaccine!!! Think about shingrex/shingles vaccine!  Health Maintenance After Age 67 After age 11, you are at a higher risk for certain long-term diseases and infections as well as injuries from falls. Falls are a major cause of broken bones and head injuries in people who are older than age 63. Getting regular preventive care can help to keep you healthy and well. Preventive care includes getting regular testing and making lifestyle changes as recommended by your health care provider. Talk with your health care provider about:  Which screenings and tests you should have. A screening is a test that checks for a disease when you have no symptoms.  A diet and exercise plan that is right for you. What should I know about screenings and tests to prevent falls? Screening and testing are the best ways to find a health problem early. Early diagnosis and treatment give you the best chance of managing medical conditions that are common after age 43. Certain conditions and lifestyle choices may make you more likely to have a fall. Your health care provider may recommend:  Regular vision checks. Poor vision and conditions such as cataracts can make you more likely to have a fall. If you wear glasses, make sure to get your prescription updated if your vision changes.  Medicine review. Work with your health care provider to regularly review all of the medicines you are taking, including over-the-counter medicines. Ask your health care provider about any side effects that may make you more likely to have a fall. Tell your health care provider if any medicines that you take make you feel dizzy or sleepy.  Osteoporosis screening. Osteoporosis is a condition that causes the bones to get weaker. This can make the bones weak and cause them to break more easily.  Blood pressure screening. Blood pressure changes and medicines to control blood pressure can make you feel dizzy.  Strength and balance checks.  Your health care provider may recommend certain tests to check your strength and balance while standing, walking, or changing positions.  Foot health exam. Foot pain and numbness, as well as not wearing proper footwear, can make you more likely to have a fall.  Depression screening. You may be more likely to have a fall if you have a fear of falling, feel emotionally low, or feel unable to do activities that you used to do.  Alcohol use screening. Using too much alcohol can affect your balance and may make you more likely to have a fall. What actions can I take to lower my risk of falls? General instructions  Talk with your health care provider about your risks for falling. Tell your health care provider if: ? You fall. Be sure to tell your health care provider about all falls, even ones that seem minor. ? You feel dizzy, sleepy, or off-balance.  Take over-the-counter and prescription medicines only as told by your health care provider. These include any supplements.  Eat a healthy diet and maintain a healthy weight. A healthy diet includes low-fat dairy products, low-fat (lean) meats, and fiber from whole grains, beans, and lots of fruits and vegetables. Home safety  Remove any tripping hazards, such as rugs, cords, and clutter.  Install safety equipment such as grab bars in bathrooms and safety rails on stairs.  Keep rooms and walkways well-lit. Activity   Follow a regular exercise program to stay fit. This will help you maintain your balance. Ask your health care provider what types of exercise are appropriate for you.  If you need a cane or walker, use it as recommended by your health care provider.  Wear supportive shoes that have nonskid soles. Lifestyle  Do not drink alcohol if your health care provider tells you not to drink.  If you drink alcohol, limit how much you have: ? 0-1 drink a day for women. ? 0-2 drinks a day for men.  Be aware of how much alcohol is in your  drink. In the [place] one drink equals one typical bottle of beer (12 oz), one-half glass of wine (5 oz), or one shot of hard liquor (1 oz).  Do not use any products that contain nicotine or tobacco, such as cigarettes and e-cigarettes. If you need help quitting, ask your health care provider. Summary  Having a healthy lifestyle and getting preventive care can help to protect your health and wellness after age 23.  Screening and testing are the best way to find a health problem early and help you avoid having a fall. Early diagnosis and treatment give you the best chance for managing medical conditions that are more common for people who are older than age 34.  Falls are a major cause of broken bones and head injuries in people who are older than age 77. Take precautions to prevent a fall at home.  Work with your health care provider to learn what changes you can make to improve your health and wellness and to prevent falls. This information is not intended to replace advice given to you by your health care provider. Make sure you discuss any questions you have with your health care provider. Document Released: 09/09/2017 Document Revised: 02/17/2019 Document Reviewed: 09/09/2017 Elsevier Patient Education  2020 [facility] Mellitus, Adult  Carbohydrate counting is a method of keeping track of how many carbohydrates you eat. Eating carbohydrates naturally increases the amount of sugar (glucose) in the blood. Counting how many carbohydrates you eat helps keep your blood glucose within normal limits, which helps you manage your diabetes (diabetes mellitus). It is important to know how many carbohydrates you can safely have in each meal. This is different for every person. A diet and nutrition specialist (registered dietitian) can help you make a meal plan and calculate how many carbohydrates you should have at each meal and snack. Carbohydrates are found in the  following foods: Grains, such as breads and cereals. Dried beans and soy products. Starchy vegetables, such as potatoes, peas, and corn. Fruit and fruit juices. Milk and yogurt. Sweets and snack foods, such as cake, cookies, candy, chips, and soft drinks. How do I count carbohydrates? There are two ways to count carbohydrates in food. You can use either of the methods or a combination of both. Reading "Nutrition Facts" on packaged food The "Nutrition Facts" list is included on the labels of almost all packaged foods and beverages in the [place] It includes: The serving size. Information about nutrients in each serving, including the grams (g) of carbohydrate per serving. To use the "Nutrition Facts": Decide how many servings you will have. Multiply the number of servings by the number of carbohydrates per serving. The resulting number is the total amount of carbohydrates that you will be having. Learning standard serving sizes of other foods When you eat carbohydrate foods that are not packaged or do not include "Nutrition Facts" on the label, you need to measure the servings in order to count the amount of carbohydrates: Measure the foods that you will eat with a  food scale or measuring cup, if needed. Decide how many standard-size servings you will eat. Multiply the number of servings by 15. Most carbohydrate-rich foods have about 15 g of carbohydrates per serving. For example, if you eat 8 oz (170 g) of strawberries, you will have eaten 2 servings and 30 g of carbohydrates (2 servings x 15 g = 30 g). For foods that have more than one food mixed, such as soups and casseroles, you must count the carbohydrates in each food that is included. The following list contains standard serving sizes of common carbohydrate-rich foods. Each of these servings has about 15 g of carbohydrates:  hamburger bun or  English muffin.  oz (15 mL) syrup.  oz (14 g) jelly. 1 slice of bread. 1 six-inch  tortilla. 3 oz (85 g) cooked rice or pasta. 4 oz (113 g) cooked dried beans. 4 oz (113 g) starchy vegetable, such as peas, corn, or potatoes. 4 oz (113 g) hot cereal. 4 oz (113 g) mashed potatoes or  of a large baked potato. 4 oz (113 g) canned or frozen fruit. 4 oz (120 mL) fruit juice. 4-6 crackers. 6 chicken nuggets. 6 oz (170 g) unsweetened dry cereal. 6 oz (170 g) plain fat-free yogurt or yogurt sweetened with artificial sweeteners. 8 oz (240 mL) milk. 8 oz (170 g) fresh fruit or one small piece of fruit. 24 oz (680 g) popped popcorn. Example of carbohydrate counting Sample meal 3 oz (85 g) chicken breast. 6 oz (170 g) brown rice. 4 oz (113 g) corn. 8 oz (240 mL) milk. 8 oz (170 g) strawberries with sugar-free whipped topping. Carbohydrate calculation Identify the foods that contain carbohydrates: Rice. Corn. Milk. Strawberries. Calculate how many servings you have of each food: 2 servings rice. 1 serving corn. 1 serving milk. 1 serving strawberries. Multiply each number of servings by 15 g: 2 servings rice x 15 g = 30 g. 1 serving corn x 15 g = 15 g. 1 serving milk x 15 g = 15 g. 1 serving strawberries x 15 g = 15 g. Add together all of the amounts to find the total grams of carbohydrates eaten: 30 g + 15 g + 15 g + 15 g = 75 g of carbohydrates total. Summary Carbohydrate counting is a method of keeping track of how many carbohydrates you eat. Eating carbohydrates naturally increases the amount of sugar (glucose) in the blood. Counting how many carbohydrates you eat helps keep your blood glucose within normal limits, which helps you manage your diabetes. A diet and nutrition specialist (registered dietitian) can help you make a meal plan and calculate how many carbohydrates you should have at each meal and snack. This information is not intended to replace advice given to you by your health care provider. Make sure you discuss any questions you have with your  health care provider. Document Released: 10/27/2005 Document Revised: 05/21/2017 Document Reviewed: 04/09/2016 Elsevier Patient Education  2020 [employer].

## 2019-08-23 NOTE — Progress Notes (Signed)
Heather Taylor 12-Aug-1951 [id number]    History:    68 y.o G0 presents for annual exam. Currently taking Macrobid 1 100 MG capsule BID for UTI, which is managed by primary care. TAH in 1985 for fibroids. No HRT .  Normal Pap and mammogram history.  2017 T score -1.7.  2018 benign colon polyp 5-year follow-up. Now retired from [employer] job. Not sexually active. Takes Vitamin D daily. Walks 4 times daily for physical activity, but has high carb diet. Denies flu, pneumonia, and Shringrex vaccine.  Past medical history, past surgical history, [name] GERD, arthritis managed by primary care.  ROS:  A ROS was performed and pertinent positives and negatives are included.  Exam:  Vitals:   08/23/19 1028  BP: (!) 148/80  Weight: 193 lb (87.5 kg)  Height: 5\' 1"  (1.549 m)   Body mass index is 36.47 kg/m.   General appearance:  Normal Thyroid:  Symmetrical, normal in size, without palpable masses or nodularity. Respiratory  Auscultation:  Clear without wheezing or rhonchi Cardiovascular  Auscultation:  Regular rate, without rubs, murmurs or gallops  Edema/varicosities:  Not grossly evident Abdominal  Soft,nontender, without masses, guarding or rebound.  Liver/spleen:  No organomegaly noted  Hernia:  None appreciated  Skin  Inspection:  Grossly normal   Breasts: Examined lying and sitting.     Right: Without masses, retractions, discharge or axillary adenopathy.     Left: Without masses, retractions, discharge or axillary adenopathy. Gentitourinary   Inguinal/mons:  Normal without inguinal adenopathy  External genitalia:  Normal  BUS/Urethra/Skene's glands:  Normal  Vagina:  Normal, dry  Cervix:  And uterus absent  Adnexa/parametria:     Rt: Without masses or tenderness.   Lt: Without masses or tenderness.  Anus and perineum: Normal  Digital rectal exam: N/A   Assessment/Plan:  68 y.o.  for annual  exam with no complaints.  Vaginal Atrophy/asymptomatic/not sexually active 1985 TAH for fibroids no HRT Osteopenia without elevated FRAX GERD- primary care manages labs and meds Obesity  Plan: Reviewed blood pressure elevated, has follow-up scheduled with primary care tomorrow will discuss elevated blood pressure then.  Reviewed importance of avoiding salty processed foods.  Educated on the importance of flu, pneumonia, and Shingrex vaccines. Provided low card diet information. Will continue exercise regimen. SBE's continue annual screening mammograms.  Home safety fall prevention and importance of weightbearing and balance exercise reviewed.    [place], 10:59 AM 08/23/2019

## 2019-08-24 LAB — URINALYSIS, COMPLETE W/RFL CULTURE
Bilirubin Urine: NEGATIVE
Glucose, UA: NEGATIVE
Hgb urine dipstick: NEGATIVE
Hyaline Cast: NONE SEEN /LPF
Ketones, ur: NEGATIVE
Leukocyte Esterase: NEGATIVE
Nitrites, Initial: POSITIVE — AB
Protein, ur: NEGATIVE
Specific Gravity, Urine: 1.014 (ref 1.001–1.03)
pH: 7 (ref 5.0–8.0)

## 2019-08-24 LAB — CULTURE INDICATED

## 2020-03-26 DIAGNOSIS — H524 Presbyopia: Secondary | ICD-10-CM | POA: Diagnosis not present

## 2020-04-17 ENCOUNTER — Other Ambulatory Visit: Payer: Self-pay | Admitting: Internal Medicine

## 2020-04-17 DIAGNOSIS — Z1231 Encounter for screening mammogram for malignant neoplasm of breast: Secondary | ICD-10-CM

## 2020-05-16 ENCOUNTER — Ambulatory Visit
Admission: RE | Admit: 2020-05-16 | Discharge: 2020-05-16 | Disposition: A | Discharge status: Home / Self Care | Payer: Medicare HMO | Source: Ambulatory Visit | Attending: Internal Medicine | Admitting: Internal Medicine

## 2020-05-16 ENCOUNTER — Other Ambulatory Visit: Payer: Self-pay

## 2020-05-16 DIAGNOSIS — Z1231 Encounter for screening mammogram for malignant neoplasm of breast: Secondary | ICD-10-CM | POA: Diagnosis not present

## 2020-08-22 DIAGNOSIS — N390 Urinary tract infection, site not specified: Secondary | ICD-10-CM | POA: Diagnosis not present

## 2020-08-22 DIAGNOSIS — R7303 Prediabetes: Secondary | ICD-10-CM | POA: Diagnosis not present

## 2020-08-22 DIAGNOSIS — E7800 Pure hypercholesterolemia, unspecified: Secondary | ICD-10-CM | POA: Diagnosis not present

## 2020-08-29 DIAGNOSIS — K219 Gastro-esophageal reflux disease without esophagitis: Secondary | ICD-10-CM | POA: Diagnosis not present

## 2020-08-29 DIAGNOSIS — M159 Polyosteoarthritis, unspecified: Secondary | ICD-10-CM | POA: Diagnosis not present

## 2020-08-29 DIAGNOSIS — Z0000 Encounter for general adult medical examination without abnormal findings: Secondary | ICD-10-CM | POA: Diagnosis not present

## 2020-08-29 DIAGNOSIS — R7303 Prediabetes: Secondary | ICD-10-CM | POA: Diagnosis not present

## 2020-08-29 DIAGNOSIS — E7800 Pure hypercholesterolemia, unspecified: Secondary | ICD-10-CM | POA: Diagnosis not present

## 2020-08-29 DIAGNOSIS — M8580 Other specified disorders of bone density and structure, unspecified site: Secondary | ICD-10-CM | POA: Diagnosis not present

## 2020-09-27 DIAGNOSIS — M8589 Other specified disorders of bone density and structure, multiple sites: Secondary | ICD-10-CM | POA: Diagnosis not present

## 2020-09-27 DIAGNOSIS — R7303 Prediabetes: Secondary | ICD-10-CM | POA: Diagnosis not present

## 2020-09-27 DIAGNOSIS — R030 Elevated blood-pressure reading, without diagnosis of hypertension: Secondary | ICD-10-CM | POA: Diagnosis not present

## 2020-09-27 DIAGNOSIS — E559 Vitamin D deficiency, unspecified: Secondary | ICD-10-CM | POA: Diagnosis not present

## 2020-09-27 DIAGNOSIS — M8580 Other specified disorders of bone density and structure, unspecified site: Secondary | ICD-10-CM | POA: Diagnosis not present

## 2020-09-27 DIAGNOSIS — E7800 Pure hypercholesterolemia, unspecified: Secondary | ICD-10-CM | POA: Diagnosis not present

## 2020-10-25 DIAGNOSIS — I10 Essential (primary) hypertension: Secondary | ICD-10-CM | POA: Diagnosis not present

## 2020-11-15 DIAGNOSIS — I10 Essential (primary) hypertension: Secondary | ICD-10-CM | POA: Diagnosis not present

## 2020-11-24 DIAGNOSIS — Z20828 Contact with and (suspected) exposure to other viral communicable diseases: Secondary | ICD-10-CM | POA: Diagnosis not present

## 2021-02-13 DIAGNOSIS — R600 Localized edema: Secondary | ICD-10-CM | POA: Diagnosis not present

## 2021-02-13 DIAGNOSIS — E559 Vitamin D deficiency, unspecified: Secondary | ICD-10-CM | POA: Diagnosis not present

## 2021-02-13 DIAGNOSIS — I10 Essential (primary) hypertension: Secondary | ICD-10-CM | POA: Diagnosis not present

## 2021-02-13 DIAGNOSIS — R0602 Shortness of breath: Secondary | ICD-10-CM | POA: Diagnosis not present

## 2021-02-13 DIAGNOSIS — R7303 Prediabetes: Secondary | ICD-10-CM | POA: Diagnosis not present

## 2021-02-28 DIAGNOSIS — R0602 Shortness of breath: Secondary | ICD-10-CM | POA: Diagnosis not present

## 2021-02-28 DIAGNOSIS — I10 Essential (primary) hypertension: Secondary | ICD-10-CM | POA: Diagnosis not present

## 2021-02-28 DIAGNOSIS — E559 Vitamin D deficiency, unspecified: Secondary | ICD-10-CM | POA: Diagnosis not present

## 2021-02-28 DIAGNOSIS — R7303 Prediabetes: Secondary | ICD-10-CM | POA: Diagnosis not present

## 2021-03-27 DIAGNOSIS — H2513 Age-related nuclear cataract, bilateral: Secondary | ICD-10-CM | POA: Diagnosis not present

## 2021-03-27 DIAGNOSIS — H33311 Horseshoe tear of retina without detachment, right eye: Secondary | ICD-10-CM | POA: Diagnosis not present

## 2021-04-02 DIAGNOSIS — I10 Essential (primary) hypertension: Secondary | ICD-10-CM | POA: Diagnosis not present

## 2021-04-10 DIAGNOSIS — H33311 Horseshoe tear of retina without detachment, right eye: Secondary | ICD-10-CM | POA: Diagnosis not present

## 2021-04-24 ENCOUNTER — Other Ambulatory Visit: Payer: Self-pay | Admitting: Internal Medicine

## 2021-04-24 DIAGNOSIS — Z1231 Encounter for screening mammogram for malignant neoplasm of breast: Secondary | ICD-10-CM

## 2021-04-26 DIAGNOSIS — H524 Presbyopia: Secondary | ICD-10-CM | POA: Diagnosis not present

## 2021-04-26 DIAGNOSIS — H2513 Age-related nuclear cataract, bilateral: Secondary | ICD-10-CM | POA: Diagnosis not present

## 2021-04-26 DIAGNOSIS — H33311 Horseshoe tear of retina without detachment, right eye: Secondary | ICD-10-CM | POA: Diagnosis not present

## 2021-05-15 DIAGNOSIS — E7800 Pure hypercholesterolemia, unspecified: Secondary | ICD-10-CM | POA: Diagnosis not present

## 2021-05-15 DIAGNOSIS — R7303 Prediabetes: Secondary | ICD-10-CM | POA: Diagnosis not present

## 2021-05-15 DIAGNOSIS — I10 Essential (primary) hypertension: Secondary | ICD-10-CM | POA: Diagnosis not present

## 2021-05-17 DIAGNOSIS — H33311 Horseshoe tear of retina without detachment, right eye: Secondary | ICD-10-CM | POA: Diagnosis not present

## 2021-06-17 ENCOUNTER — Other Ambulatory Visit: Payer: Self-pay

## 2021-06-17 ENCOUNTER — Ambulatory Visit
Admission: RE | Admit: 2021-06-17 | Discharge: 2021-06-17 | Disposition: A | Discharge status: Home / Self Care | Payer: Medicare HMO | Source: Ambulatory Visit | Attending: Internal Medicine | Admitting: Internal Medicine

## 2021-06-17 DIAGNOSIS — Z1231 Encounter for screening mammogram for malignant neoplasm of breast: Secondary | ICD-10-CM | POA: Diagnosis not present

## 2021-07-26 DIAGNOSIS — H33311 Horseshoe tear of retina without detachment, right eye: Secondary | ICD-10-CM | POA: Diagnosis not present

## 2021-08-23 ENCOUNTER — Other Ambulatory Visit: Payer: Self-pay

## 2021-08-23 ENCOUNTER — Encounter: Payer: Self-pay | Admitting: Nurse Practitioner

## 2021-08-23 ENCOUNTER — Ambulatory Visit (INDEPENDENT_AMBULATORY_CARE_PROVIDER_SITE_OTHER): Payer: Medicare HMO | Admitting: Nurse Practitioner

## 2021-08-23 VITALS — BP 124/78 | Ht 61.0 in | Wt 187.0 lb

## 2021-08-23 DIAGNOSIS — Z01419 Encounter for gynecological examination (general) (routine) without abnormal findings: Secondary | ICD-10-CM | POA: Diagnosis not present

## 2021-08-23 DIAGNOSIS — Z780 Asymptomatic menopausal state: Secondary | ICD-10-CM

## 2021-08-23 DIAGNOSIS — M8589 Other specified disorders of bone density and structure, multiple sites: Secondary | ICD-10-CM

## 2021-08-23 NOTE — Progress Notes (Signed)
   NELIDA MANDARINO Dec 07, 1950 [id number]   History:  70 y.o. G0 presents for breast and pelvic exam. No GYN complaints. Postmenopausal - no HRT. S/P 1985 TAH for fibroids. Normal pap history. Osteopenia, OA.   Gynecologic History Patient's last menstrual period was 04/10/1984.   Contraception: status post hysterectomy Sexually active: No  Health Maintenance Last Pap: 2013 Last mammogram: 06/17/2021. Results were: Normal Last colonoscopy: 12/12/2016. Results were: Tubular adenoma Last Dexa: 08/18/2018. Results were: T-score -1.4, FRAX 3.5% / 0.3%  Past medical history, past surgical history, [name] Divorced. Retired. [employer] 70 yo a few hour each day.   ROS:  A ROS was performed and pertinent positives and negatives are included.  Exam:  Vitals:   08/23/21 0938  BP: 124/78  Weight: 187 lb (84.8 kg)  Height: 5\' 1"  (1.549 m)   Body mass index is 35.33 kg/m.  General appearance:  Normal Thyroid:  Symmetrical, normal in size, without palpable masses or nodularity. Respiratory  Auscultation:  Clear without wheezing or rhonchi Cardiovascular  Auscultation:  Regular rate, without rubs, murmurs or gallops  Edema/varicosities:  Not grossly evident Abdominal  Soft,nontender, without masses, guarding or rebound.  Liver/spleen:  No organomegaly noted  Hernia:  None appreciated  Skin  Inspection:  Grossly normal Breasts: Examined lying and sitting.   Right: Without masses, retractions, nipple discharge or axillary adenopathy.   Left: Without masses, retractions, nipple discharge or axillary adenopathy. Genitourinary   Inguinal/mons:  Normal without inguinal adenopathy  External genitalia:  Normal appearing vulva with no masses, tenderness, or lesions  BUS/Urethra/Skene's glands:  Normal  Vagina:  Normal appearing with normal color and discharge, no lesions  Cervix:  Absent  Uterus:   Absent  Adnexa/parametria:     Rt: Normal in size, without masses or tenderness.   Lt: Normal in size, without masses or tenderness.  Anus and perineum: Normal  Digital rectal exam: Normal sphincter tone without palpated masses or tenderness  Patient informed chaperone available to be present for breast and pelvic exam. Patient has requested no chaperone to be present. Patient has been advised what will be completed during breast and pelvic exam.   Assessment/Plan:  70 y.o. G0 for breast and pelvic exam.   Well female exam with routine gynecological exam - Education provided on SBEs, importance of preventative screenings, current guidelines, high calcium diet, regular exercise, and multivitamin daily.  Labs with PCP.  Postmenopausal - Plan: DG Bone Density. No HRT, no bleeding. S/P 1985 TAH for fibroids.  Osteopenia of multiple sites - Plan: DG Bone Density. Due for DXA now. Continue Vitamin D + Calcium and regular exercise. She goes to gym most days.   Screening for cervical cancer - Normal Pap history. No longer screening per guidelines.   Screening for breast cancer - Normal mammogram history.  Continue annual screenings.  Normal breast exam today.  Screening for colon cancer - 2018 colonoscopy. Will repeat at GI's recommended interval.   Return in 2 years for breast and pelvic exam.   Tamela Gammon DNP, 9:53 AM 08/23/2021

## 2021-08-27 DIAGNOSIS — R7303 Prediabetes: Secondary | ICD-10-CM | POA: Diagnosis not present

## 2021-08-27 DIAGNOSIS — I10 Essential (primary) hypertension: Secondary | ICD-10-CM | POA: Diagnosis not present

## 2021-08-27 DIAGNOSIS — E7800 Pure hypercholesterolemia, unspecified: Secondary | ICD-10-CM | POA: Diagnosis not present

## 2021-08-27 DIAGNOSIS — D550 Anemia due to glucose-6-phosphate dehydrogenase [G6PD] deficiency: Secondary | ICD-10-CM | POA: Diagnosis not present

## 2021-09-03 DIAGNOSIS — K219 Gastro-esophageal reflux disease without esophagitis: Secondary | ICD-10-CM | POA: Diagnosis not present

## 2021-09-03 DIAGNOSIS — H6121 Impacted cerumen, right ear: Secondary | ICD-10-CM | POA: Diagnosis not present

## 2021-09-03 DIAGNOSIS — I10 Essential (primary) hypertension: Secondary | ICD-10-CM | POA: Diagnosis not present

## 2021-09-03 DIAGNOSIS — N1831 Chronic kidney disease, stage 3a: Secondary | ICD-10-CM | POA: Diagnosis not present

## 2021-09-03 DIAGNOSIS — Z6835 Body mass index (BMI) 35.0-35.9, adult: Secondary | ICD-10-CM | POA: Diagnosis not present

## 2021-09-03 DIAGNOSIS — Z0000 Encounter for general adult medical examination without abnormal findings: Secondary | ICD-10-CM | POA: Diagnosis not present

## 2021-09-03 DIAGNOSIS — M159 Polyosteoarthritis, unspecified: Secondary | ICD-10-CM | POA: Diagnosis not present

## 2021-09-03 DIAGNOSIS — Z23 Encounter for immunization: Secondary | ICD-10-CM | POA: Diagnosis not present

## 2021-09-03 DIAGNOSIS — E7800 Pure hypercholesterolemia, unspecified: Secondary | ICD-10-CM | POA: Diagnosis not present

## 2021-09-03 DIAGNOSIS — M8580 Other specified disorders of bone density and structure, unspecified site: Secondary | ICD-10-CM | POA: Diagnosis not present

## 2021-09-03 DIAGNOSIS — E559 Vitamin D deficiency, unspecified: Secondary | ICD-10-CM | POA: Diagnosis not present

## 2021-09-03 DIAGNOSIS — R7303 Prediabetes: Secondary | ICD-10-CM | POA: Diagnosis not present

## 2021-09-04 ENCOUNTER — Ambulatory Visit (INDEPENDENT_AMBULATORY_CARE_PROVIDER_SITE_OTHER): Payer: Medicare HMO

## 2021-09-04 ENCOUNTER — Other Ambulatory Visit: Payer: Self-pay

## 2021-09-04 ENCOUNTER — Other Ambulatory Visit: Payer: Self-pay | Admitting: Nurse Practitioner

## 2021-09-04 DIAGNOSIS — M8589 Other specified disorders of bone density and structure, multiple sites: Secondary | ICD-10-CM

## 2021-09-04 DIAGNOSIS — Z780 Asymptomatic menopausal state: Secondary | ICD-10-CM

## 2021-09-06 DIAGNOSIS — H2513 Age-related nuclear cataract, bilateral: Secondary | ICD-10-CM | POA: Diagnosis not present

## 2021-09-06 DIAGNOSIS — H33311 Horseshoe tear of retina without detachment, right eye: Secondary | ICD-10-CM | POA: Diagnosis not present

## 2021-12-17 DIAGNOSIS — I10 Essential (primary) hypertension: Secondary | ICD-10-CM | POA: Diagnosis not present

## 2021-12-17 DIAGNOSIS — R7303 Prediabetes: Secondary | ICD-10-CM | POA: Diagnosis not present

## 2021-12-17 DIAGNOSIS — E7800 Pure hypercholesterolemia, unspecified: Secondary | ICD-10-CM | POA: Diagnosis not present

## 2021-12-17 DIAGNOSIS — F5101 Primary insomnia: Secondary | ICD-10-CM | POA: Diagnosis not present

## 2022-01-14 ENCOUNTER — Encounter: Payer: Self-pay | Admitting: Gastroenterology

## 2022-04-21 ENCOUNTER — Encounter: Payer: Self-pay | Admitting: Nurse Practitioner

## 2022-04-21 DIAGNOSIS — M25551 Pain in right hip: Secondary | ICD-10-CM | POA: Diagnosis not present

## 2022-04-29 NOTE — Progress Notes (Unsigned)
    Subjective:    CC: R hip pain  I, Duffy Dantonio, LAT, ATC, am serving as scribe for Dr. Lynne Leader.  HPI: Pt is a 71 y/o female presenting w/ c/o R hip pain x .  She locates her pain to .  Radiating pain: R hip mechanical symptoms: Aggravating factors: Treatments tried:   Pertinent review of Systems: ***  Relevant historical information: ***   Objective:   There were no vitals filed for this visit. General: Well Developed, well nourished, and in no acute distress.   MSK: ***  Lab and Radiology Results No results found for this or any previous visit (from the past 72 hour(s)). No results found.    Impression and Recommendations:    Assessment and Plan: 71 y.o. female with ***.  PDMP not reviewed this encounter. No orders of the defined types were placed in this encounter.  No orders of the defined types were placed in this encounter.   Discussed warning signs or symptoms. Please see discharge instructions. Patient expresses understanding.   ***

## 2022-05-05 ENCOUNTER — Ambulatory Visit: Payer: Medicare HMO | Admitting: Family Medicine

## 2022-05-05 ENCOUNTER — Ambulatory Visit: Payer: Self-pay

## 2022-05-05 VITALS — BP 172/94 | HR 76 | Ht 61.0 in | Wt 196.0 lb

## 2022-05-05 DIAGNOSIS — E7800 Pure hypercholesterolemia, unspecified: Secondary | ICD-10-CM | POA: Insufficient documentation

## 2022-05-05 DIAGNOSIS — N1831 Chronic kidney disease, stage 3a: Secondary | ICD-10-CM | POA: Insufficient documentation

## 2022-05-05 DIAGNOSIS — Z880 Allergy status to penicillin: Secondary | ICD-10-CM | POA: Insufficient documentation

## 2022-05-05 DIAGNOSIS — M1611 Unilateral primary osteoarthritis, right hip: Secondary | ICD-10-CM | POA: Diagnosis not present

## 2022-05-05 DIAGNOSIS — M1990 Unspecified osteoarthritis, unspecified site: Secondary | ICD-10-CM | POA: Insufficient documentation

## 2022-05-05 DIAGNOSIS — M25551 Pain in right hip: Secondary | ICD-10-CM

## 2022-05-05 DIAGNOSIS — I10 Essential (primary) hypertension: Secondary | ICD-10-CM | POA: Insufficient documentation

## 2022-05-05 DIAGNOSIS — R7303 Prediabetes: Secondary | ICD-10-CM | POA: Insufficient documentation

## 2022-05-05 DIAGNOSIS — K219 Gastro-esophageal reflux disease without esophagitis: Secondary | ICD-10-CM | POA: Insufficient documentation

## 2022-05-05 DIAGNOSIS — R600 Localized edema: Secondary | ICD-10-CM | POA: Diagnosis not present

## 2022-05-05 DIAGNOSIS — E559 Vitamin D deficiency, unspecified: Secondary | ICD-10-CM | POA: Insufficient documentation

## 2022-05-05 DIAGNOSIS — Z791 Long term (current) use of non-steroidal anti-inflammatories (NSAID): Secondary | ICD-10-CM | POA: Insufficient documentation

## 2022-05-05 DIAGNOSIS — R0683 Snoring: Secondary | ICD-10-CM | POA: Insufficient documentation

## 2022-05-05 DIAGNOSIS — I872 Venous insufficiency (chronic) (peripheral): Secondary | ICD-10-CM | POA: Diagnosis not present

## 2022-05-05 DIAGNOSIS — Z8709 Personal history of other diseases of the respiratory system: Secondary | ICD-10-CM | POA: Insufficient documentation

## 2022-05-14 ENCOUNTER — Ambulatory Visit: Payer: Medicare HMO | Attending: Family Medicine

## 2022-05-14 DIAGNOSIS — M25551 Pain in right hip: Secondary | ICD-10-CM

## 2022-05-14 DIAGNOSIS — M6281 Muscle weakness (generalized): Secondary | ICD-10-CM | POA: Diagnosis not present

## 2022-05-14 NOTE — Therapy (Signed)
OUTPATIENT PHYSICAL THERAPY LOWER EXTREMITY EVALUATION   Patient Name: Heather Taylor MRN: [id number] DOB:13-May-1951, 71 y.o., female Today's Date: 05/14/2022   PT End of Session - 05/14/22 0949     Visit Number 1    Number of Visits 9    Date for PT Re-Evaluation 07/09/22    Authorization Type [employer]    PT Start Time 6270    PT Stop Time 3500    PT Time Calculation (min) 40 min    Activity Tolerance Patient tolerated treatment well    Behavior During Therapy [facility] for tasks assessed/performed             Past Medical History:  Diagnosis Date   Arthritis    Fibroid    GERD (gastroesophageal reflux disease)    Past Surgical History:  Procedure Laterality Date   ABDOMINAL HYSTERECTOMY  1985   TAH   Patient Active Problem List   Diagnosis Date Noted   Chronic kidney disease, stage 3a ([place]) 05/05/2022   Long term (current) use of non-steroidal anti-inflammatories (nsaid) 05/05/2022   Gastroesophageal reflux disease 05/05/2022   History of asthma 05/05/2022   Penicillin allergy 05/05/2022   Prediabetes 05/05/2022   Primary hypertension 05/05/2022   Pure hypercholesterolemia 05/05/2022   Snoring 05/05/2022   Vitamin D deficiency 05/05/2022   Osteoarthritis 05/05/2022   Morbid obesity ([place]) 05/05/2022   Primary osteoarthritis of right hip 05/05/2022   Osteopenia    Arthritis     PCP:  Holland Commons, FNP  REFERRING PROVIDER: Gregor Hams, MD  REFERRING DIAG: [phone] (ICD-10-CM) - Right hip pain  THERAPY DIAG:  Pain in right hip - Plan: PT plan of care cert/re-cert  Muscle weakness (generalized) - Plan: PT plan of care cert/re-cert  Rationale for Evaluation and Treatment Rehabilitation  ONSET DATE: 03/31/2022  SUBJECTIVE:   SUBJECTIVE STATEMENT: Pt presents to PT with roughly 2 month hx of R hip pain. Denies trauma or MOI, does think she may have tripped around time that her hip started to hurt more. Has particular pain and stiffness in  the morning and is very active, working out three times a week. Denies any numbness/tingling and generally feels like she still gets around well. Was caring for a 71 y/o and notes that those caregiving duties do hurt her R hip.   PERTINENT HISTORY: CKD, HTN  PAIN:  Are you having pain?  Yes: NPRS scale: 5/10 (8/10 at worst) Pain location: R hip, R groin Pain description: stiff, achy Aggravating factors: morning stiffness, prolonged standing Relieving factors: positioning, movement  PRECAUTIONS: None  WEIGHT BEARING RESTRICTIONS No  FALLS:  Has patient fallen in last 6 months? No  LIVING ENVIRONMENT: Lives with: lives with their family Lives in: House/apartment Stairs: No Has following equipment at home: Grab bars  OCCUPATION: Retired  PLOF: Independent and Independent with basic ADLs  PATIENT GOALS: decrease R hip pain in order to improve comfort with community activities and caregiving duties    OBJECTIVE:   DIAGNOSTIC FINDINGS:   See imagining  PATIENT SURVEYS:  FOTO: 73% function; 78% predicted   COGNITION:  Overall cognitive status: Within functional limits for tasks assessed     SENSATION: WFL  MUSCLE LENGTH: Thomas test: Right (+) deg; Left (-) deg  POSTURE:  Medium body habitus; rounded shoulders, fwd head  PALPATION: TTP to R anterior/lateral hip  LOWER EXTREMITY ROM:     AROM Right 05/14/2022   Left 05/14/2022   Hip flexion  Hip extension    Hip abduction    Hip adduction    Hip internal rotation 19 p! 27  Hip external rotation 40 43  Knee flexion    Knee extension    Ankle dorsiflexion    Ankle plantarflexion    Ankle inversion    Ankle eversion       (Blank rows = not tested)  LOWER EXTREMITY MMT:    MMT Right 05/14/2022  Left 05/14/2022   Hip flexion  3+/5 p! 3+/5  Hip extension    Hip abduction 3+/5 3+/5  Hip adduction    Hip external rotation    Hip internal rotation    Knee extension 5/5 5/5  Knee flexion 5/5 5/5  Ankle  dorsiflexion     Ankle plantarflexion    Ankle inversion    Ankle eversion    Grossly    (Blank rows = not tested)  LOWER EXTREMITY SPECIAL TESTS:  N/A  FUNCTIONAL TESTS:  30 Second Sit to Stand: 13 reps  GAIT: Distance walked: 47f Assistive device utilized: None Level of assistance: Complete Independence Comments: slight R antalgia   TODAY'S TREATMENT: [facility] Adult PT Treatment:                                                DATE: 05/14/2022 Therapeutic Exercise: STS x 10 SLR x 5 each S/L hip abd x 5 each S/L clamshell GTB x 5 each Supine fig 4 stretch x 30" R Modified thomas x 30" R  PATIENT EDUCATION:  Education details: eval findings, FOTO, HEP, POC Person educated: PDoctor, general practice Explanation, Demonstration, and Handouts Education comprehension: verbalized understanding and returned demonstration   HOME EXERCISE PROGRAM: Access Code: [email or web address] URL: [email or web address] Date: 05/14/2022 Prepared by: DOctavio Manns Exercises - Sit to Stand Without Arm Support  - 4 x weekly - 3 sets - 10 reps - Active Straight Leg Raise with Quad Set  - 4 x weekly - 3 sets - 10 reps - Sidelying Hip Abduction  - 4 x weekly - 3 sets - 10 reps - Clamshell with Resistance  - 4 x weekly - 3 sets - 10 reps - Supine Figure 4 Piriformis Stretch  - 1-2 x daily - 7 x weekly - 2-3 reps - 30 sec hold - Modified Thomas Stretch  - 1-2 x daily - 7 x weekly - 2-3 reps - 30 sec hold  ASSESSMENT:  CLINICAL IMPRESSION: Patient is a 71y.o. F who was seen today for physical therapy evaluation and treatment for chronic R hip pain. Physical findings are consistent with physician impression as pt has decrease in R hip strength and AROM. Her FOTO score shows she is operating slightly below her PLOF indicating she would benefit from skilled PT services for decreasing pain and improving function. Therapy will focus on proximal hip strengthening and improving muscle length.      OBJECTIVE IMPAIRMENTS decreased activity tolerance, decreased mobility, difficulty walking, decreased ROM, decreased strength, and pain.   ACTIVITY LIMITATIONS sitting, standing, squatting, and stairs  PARTICIPATION LIMITATIONS: community activity, occupation, and yard work  PERSONAL FACTORS 1-2 comorbidities: CKD, HTN  are also affecting patient's functional outcome.   REHAB POTENTIAL: Excellent  CLINICAL DECISION MAKING: Stable/uncomplicated  EVALUATION COMPLEXITY: Low   GOALS: Goals reviewed with patient? No  SHORT TERM GOALS: Target date: 06/04/2022  Pt will be  compliant and knowledgeable with initial HEP for improved comfort and carryover Baseline: initial HEP given  Goal status: INITIAL  2.  Pt will self report R hip pain no greater than 5/10 for improved comfort and functional ability Baseline: 8/10 at worst Goal status: INITIAL  LONG TERM GOALS: Target date: 07/09/2022   Pt will self report R hip pain no greater than 1-2/10 for improved comfort and functional ability Baseline: 8/10 at worst Goal status: INITIAL  2.  Pt will improve FOTO function score to no less than 78% as proxy for functional improvement Baseline: 73% function Goal status: INITIAL  3.  Pt will improve bilateral LE MMT to no less than 4/5 for improved functional mobility and decrease pain Baseline: see chart Goal status: INITIAL  4.  Pt will improve R hip IR AROM to at least 25 degrees for improved comfort and function Baseline: 19 degrees  Goal status: INITIAL  PLAN: PT FREQUENCY: 1x/week  PT DURATION: 8 weeks  PLANNED INTERVENTIONS: Therapeutic exercises, Therapeutic activity, Neuromuscular re-education, Balance training, Gait training, Patient/Family education, Joint mobilization, Aquatic Therapy, Dry Needling, Electrical stimulation, Cryotherapy, Moist heat, Manual therapy, and Re-evaluation  PLAN FOR NEXT SESSION: assess HEP response, progress as able Referring diagnosis? M25.551  (ICD-10-CM) - Right hip pain Treatment diagnosis? (if different than referring diagnosis)  Pain in right hip Muscle weakness (generalized) What was this (referring dx) caused by? '[]'$  Surgery '[]'$  Fall '[]'$  Ongoing issue '[x]'$  Arthritis '[]'$  Other: ____________  Laterality: '[x]'$  Rt '[]'$  Lt '[]'$  Both  Check all possible CPT codes:  *CHOOSE 10 OR LESS*    '[x]'$  97110 (Therapeutic Exercise)  '[]'$  92507 (SLP Treatment)  '[x]'$  97112 (Neuro Re-ed)   '[]'$  92526 (Swallowing Treatment)   '[x]'$  97116 (Gait Training)   '[]'$  [id number] (Cognitive Training, 1st 15 minutes) '[x]'$  97140 (Manual Therapy)   '[]'$  97130 (Cognitive Training, each add'l 15 minutes)  '[x]'$  97164 (Re-evaluation)                              '[]'$  Other, List CPT Code ____________  '[x]'$  32992 (Therapeutic Activities)     '[x]'$  97535 (Self Care)   '[]'$  All codes above (97110 - 97535)  '[]'$  42683 (Mechanical Traction)  '[]'$  97014 (E-stim Unattended)  '[]'$  97032 (E-stim manual)  '[]'$  97033 (Ionto)  '[]'$  97035 (Ultrasound) '[]'$  97750 (Physical Performance Training) '[]'$  [id number] (Aquatic Therapy) '[]'$  97016 (Vasopneumatic Device) '[]'$  97018 (Paraffin) '[]'$  97034 (Contrast Bath) '[]'$  97597 (Wound Care 1st 20 sq cm) '[]'$  97598 (Wound Care each add'l 20 sq cm) '[]'$  97760 (Orthotic Fabrication, Fitting, Training Initial) '[]'$  [id number] (Prosthetic Management and Training Initial) '[]'$  [id number] (Orthotic or Prosthetic Training/ Modification Subsequent)    Ward Chatters, PT 05/14/2022, 10:48 AM

## 2022-05-15 NOTE — Therapy (Signed)
OUTPATIENT PHYSICAL THERAPY TREATMENT NOTE   Patient Name: Heather Taylor MRN: [id number] DOB:[birth year], 71 y.o., female Today's Date: 05/20/2022  PCP:  Holland Commons, FNP  REFERRING PROVIDER: Gregor Hams, MD  END OF SESSION:   PT End of Session - 05/20/22 1616     Visit Number 2    Number of Visits 9    Date for PT Re-Evaluation 07/09/22    Authorization Type [employer]    PT Start Time 1545    PT Stop Time 1627    PT Time Calculation (min) 42 min    Activity Tolerance Patient tolerated treatment well    Behavior During Therapy WFL for tasks assessed/performed             Past Medical History:  Diagnosis Date   Arthritis    Fibroid    GERD (gastroesophageal reflux disease)    Past Surgical History:  Procedure Laterality Date   ABDOMINAL HYSTERECTOMY  1985   TAH   Patient Active Problem List   Diagnosis Date Noted   Chronic kidney disease, stage 3a ([place]) 05/05/2022   Long term (current) use of non-steroidal anti-inflammatories (nsaid) 05/05/2022   Gastroesophageal reflux disease 05/05/2022   History of asthma 05/05/2022   Penicillin allergy 05/05/2022   Prediabetes 05/05/2022   Primary hypertension 05/05/2022   Pure hypercholesterolemia 05/05/2022   Snoring 05/05/2022   Vitamin D deficiency 05/05/2022   Osteoarthritis 05/05/2022   Morbid obesity ([place]) 05/05/2022   Primary osteoarthritis of right hip 05/05/2022   Osteopenia    Arthritis     REFERRING DIAG: M25.551 (ICD-10-CM) - Right hip pain  THERAPY DIAG:  Pain in right hip  Muscle weakness (generalized)  Rationale for Evaluation and Treatment Rehabilitation  PERTINENT HISTORY: CKD, HTN  PRECAUTIONS: None  SUBJECTIVE: Pt reports doing her HEP which has helped to reduce her pain since eval. She states that she mostly notes pain in the mornings upon waking with it easing off as the day goes on.   PAIN:  Are you having pain?  Yes: NPRS scale: 5/10 (8/10 at worst) Pain  location: R hip, R groin Pain description: stiff, achy Aggravating factors: morning stiffness, prolonged standing Relieving factors: positioning, movement   OBJECTIVE:    DIAGNOSTIC FINDINGS:            See imagining   PATIENT SURVEYS:  FOTO: 73% function; 78% predicted    COGNITION:           Overall cognitive status: Within functional limits for tasks assessed                          SENSATION: WFL   MUSCLE LENGTH: Thomas test: Right (+) deg; Left (-) deg   POSTURE:  Medium body habitus; rounded shoulders, fwd head   PALPATION: TTP to R anterior/lateral hip   LOWER EXTREMITY ROM:      AROM Right 05/14/2022   Left 05/14/2022   Hip flexion       Hip extension      Hip abduction      Hip adduction      Hip internal rotation 19 p! 27  Hip external rotation 40 43  Knee flexion      Knee extension      Ankle dorsiflexion      Ankle plantarflexion      Ankle inversion      Ankle eversion                            (  Blank rows = not tested)   LOWER EXTREMITY MMT:     MMT Right 05/14/2022  Left 05/14/2022   Hip flexion  3+/5 p! 3+/5  Hip extension      Hip abduction 3+/5 3+/5  Hip adduction      Hip external rotation      Hip internal rotation      Knee extension 5/5 5/5  Knee flexion 5/5 5/5  Ankle dorsiflexion       Ankle plantarflexion      Ankle inversion      Ankle eversion      Grossly      (Blank rows = not tested)   LOWER EXTREMITY SPECIAL TESTS:  N/A   FUNCTIONAL TESTS:  30 Second Sit to Stand: 13 reps   GAIT: Distance walked: 49f Assistive device utilized: None Level of assistance: Complete Independence Comments: slight R antalgia    TODAY'S TREATMENT: [facility] Adult PT Treatment:                                                DATE: 05/20/2022 Therapeutic Exercise: NuStep lvl 6 5 min Supine fig 4 stretch x 30" R Modified thomas x 30" R SLR 2 x 10 each with 2# Each  S/L hip abd 2 x 10 2# Each  S/L clamshell GTB x 20 each Bridges x  20 STS x 20 Seated marching 1 min x 2 Manual:  Lateral hip mob with belt.   [facility]                                                [facility]: [facility]14[facility]20[facility] Therapeutic Exercise: STS x 10 SLR x 5 each S/L hip abd x 5 each S/L clamshell GTB x 5 each Supine fig 4 stretch x 30" R Modified thomas x 30" R   PATIENT EDUCATION:  Education details: eval findings, FOTO, HEP, POC Person educated: PDoctor, general practice Explanation, Demonstration, and Handouts Education comprehension: verbalized understanding and returned demonstration     HOME EXERCISE PROGRAM: Access Code: [email or web address] URL: [email or web address] Date: 05/14/2022 Prepared by: DOctavio Manns  Exercises - Sit to Stand Without Arm Support  - 4 x weekly - 3 sets - 10 reps - Active Straight Leg Raise with Quad Set  - 4 x weekly - 3 sets - 10 reps - Sidelying Hip Abduction  - 4 x weekly - 3 sets - 10 reps - Clamshell with Resistance  - 4 x weekly - 3 sets - 10 reps - Supine Figure 4 Piriformis Stretch  - 1-2 x daily - 7 x weekly - 2-3 reps - 30 sec hold - Modified Thomas Stretch  - 1-2 x daily - 7 x weekly - 2-3 reps - 30 sec hold   ASSESSMENT: CLINICAL IMPRESSION: Pt presents to first follow up appt with reports of improved pain and mobility. Pt taken through HEP with good forum. Challenged her further with increased weight with notable fatigue with increased reps but no reported pain. Added bridges with pt requiring cues for controlled motion. She reports a catch with ambulation, that was improved after lateral hip mobs and long axis distraction. Pt will continue to benefit from skilled PT to address continued deficits.  OBJECTIVE IMPAIRMENTS decreased activity tolerance, decreased mobility, difficulty walking, decreased ROM, decreased strength, and pain.    ACTIVITY LIMITATIONS sitting, standing, squatting, and stairs   PARTICIPATION LIMITATIONS: community activity, occupation, and yard work    PERSONAL FACTORS 1-2 comorbidities: CKD, HTN  are also affecting patient's functional outcome.    REHAB POTENTIAL: Excellent   CLINICAL DECISION MAKING: Stable/uncomplicated   EVALUATION COMPLEXITY: Low     GOALS: Goals reviewed with patient? No   SHORT TERM GOALS: Target date: 06/04/2022  Pt will be compliant and knowledgeable with initial HEP for improved comfort and carryover Baseline: initial HEP given  Goal status: INITIAL   2.  Pt will self report R hip pain no greater than 5/10 for improved comfort and functional ability Baseline: 8/10 at worst Goal status: INITIAL   LONG TERM GOALS: Target date: 07/09/2022    Pt will self report R hip pain no greater than 1-2/10 for improved comfort and functional ability Baseline: 8/10 at worst Goal status: INITIAL   2.  Pt will improve FOTO function score to no less than 78% as proxy for functional improvement Baseline: 73% function Goal status: INITIAL   3.  Pt will improve bilateral LE MMT to no less than 4/5 for improved functional mobility and decrease pain Baseline: see chart Goal status: INITIAL   4.  Pt will improve R hip IR AROM to at least 25 degrees for improved comfort and function Baseline: 19 degrees  Goal status: INITIAL   PLAN: PT FREQUENCY: 1x/week   PT DURATION: 8 weeks   PLANNED INTERVENTIONS: Therapeutic exercises, Therapeutic activity, Neuromuscular re-education, Balance training, Gait training, Patient/Family education, Joint mobilization, Aquatic Therapy, Dry Needling, Electrical stimulation, Cryotherapy, Moist heat, Manual therapy, and Re-evaluation   PLAN FOR NEXT SESSION: assess HEP response, progress as able  Lynden Ang, PT 05/20/2022, 4:28 PM

## 2022-05-19 ENCOUNTER — Other Ambulatory Visit: Payer: Self-pay | Admitting: Internal Medicine

## 2022-05-19 DIAGNOSIS — Z1231 Encounter for screening mammogram for malignant neoplasm of breast: Secondary | ICD-10-CM

## 2022-05-20 ENCOUNTER — Ambulatory Visit: Payer: Medicare HMO | Admitting: Physical Therapy

## 2022-05-20 DIAGNOSIS — M25551 Pain in right hip: Secondary | ICD-10-CM | POA: Diagnosis not present

## 2022-05-20 DIAGNOSIS — M6281 Muscle weakness (generalized): Secondary | ICD-10-CM

## 2022-05-23 NOTE — Therapy (Unsigned)
OUTPATIENT PHYSICAL THERAPY TREATMENT NOTE   Patient Name: Heather Taylor MRN: [id number] DOB:27-Feb-1951, 71 y.o., female Today's Date: 05/23/2022  PCP:  Holland Commons, FNP  REFERRING PROVIDER: Gregor Hams, MD  END OF SESSION:     Past Medical History:  Diagnosis Date   Arthritis    Fibroid    GERD (gastroesophageal reflux disease)    Past Surgical History:  Procedure Laterality Date   ABDOMINAL HYSTERECTOMY  1985   TAH   Patient Active Problem List   Diagnosis Date Noted   Chronic kidney disease, stage 3a ([place]) 05/05/2022   Long term (current) use of non-steroidal anti-inflammatories (nsaid) 05/05/2022   Gastroesophageal reflux disease 05/05/2022   History of asthma 05/05/2022   Penicillin allergy 05/05/2022   Prediabetes 05/05/2022   Primary hypertension 05/05/2022   Pure hypercholesterolemia 05/05/2022   Snoring 05/05/2022   Vitamin D deficiency 05/05/2022   Osteoarthritis 05/05/2022   Morbid obesity ([place]) 05/05/2022   Primary osteoarthritis of right hip 05/05/2022   Osteopenia    Arthritis     REFERRING DIAG: M25.551 (ICD-10-CM) - Right hip pain  THERAPY DIAG:  No diagnosis found.  Rationale for Evaluation and Treatment Rehabilitation  PERTINENT HISTORY: CKD, HTN  PRECAUTIONS: None  SUBJECTIVE: Pt reports to PT with no pain. She reports having a catch every now and then, but nothing like she had when she first started. Pt is appropriate and ready to start session at this time.   PAIN:  Are you having pain?  Yes: NPRS scale: 0/10 (8/10 at worst) Pain location: R hip, R groin Pain description: stiff, achy Aggravating factors: morning stiffness, prolonged standing Relieving factors: positioning, movement   OBJECTIVE:    DIAGNOSTIC FINDINGS:            See imagining   PATIENT SURVEYS:  FOTO: 73% function; 78% predicted    COGNITION:           Overall cognitive status: Within functional limits for tasks assessed                           SENSATION: WFL   MUSCLE LENGTH: Thomas test: Right (+) deg; Left (-) deg   POSTURE:  Medium body habitus; rounded shoulders, fwd head   PALPATION: TTP to R anterior/lateral hip   LOWER EXTREMITY ROM:      AROM Right 05/14/2022   Left 05/14/2022   Hip flexion       Hip extension      Hip abduction      Hip adduction      Hip internal rotation 19 p! 27  Hip external rotation 40 43  Knee flexion      Knee extension      Ankle dorsiflexion      Ankle plantarflexion      Ankle inversion      Ankle eversion                            (Blank rows = not tested)   LOWER EXTREMITY MMT:     MMT Right 05/14/2022  Left 05/14/2022   Hip flexion  3+/5 p! 3+/5  Hip extension      Hip abduction 3+/5 3+/5  Hip adduction      Hip external rotation      Hip internal rotation      Knee extension 5/5 5/5  Knee flexion 5/5 5/5  Ankle  dorsiflexion       Ankle plantarflexion      Ankle inversion      Ankle eversion      Grossly      (Blank rows = not tested)   LOWER EXTREMITY SPECIAL TESTS:  N/A   FUNCTIONAL TESTS:  30 Second Sit to Stand: 13 reps   GAIT: Distance walked: 45f Assistive device utilized: None Level of assistance: Complete Independence Comments: slight R antalgia    TODAY'S TREATMENT: [facility] Adult PT Treatment:                                                DATE: 05/27/2022 Therapeutic Exercise: NuStep lvl 6 5 min Supine fig 4 stretch x 30" R Modified thomas x 30" R SLR 2 x 10 each with 3# Each  S/L hip abd 2 x 10 3# Each  S/L clamshell BTB x 20 each Bridges 20 x with BTB around knees. Cues for glute squeeze.  STS x 20 with airex pad, without use of UE Seated marching 30 sec x 2 Hip adductor squeeze 5 sec hold x 20  Manual:  Lateral hip mob with belt.  Long axis distraction  [facility]                                                [facility]: [facility]20[facility] Therapeutic Exercise: NuStep lvl 6 5 min Supine fig 4 stretch x 30" R Modified thomas x  30" R SLR 2 x 10 each with 2# Each  S/L hip abd 2 x 10 2# Each  S/L clamshell GTB x 20 each Bridges x 20 STS x 20 Seated marching 1 min x 2 Manual:  Lateral hip mob with belt.   [facility]                                                [facility]: [facility]22 Therapeutic Exercise: STS x 10 SLR x 5 each S/L hip abd x 5 each S/L clamshell GTB x 5 each Supine fig 4 stretch x 30" R Modified thomas x 30" R   PATIENT EDUCATION:  Education details: eval findings, FOTO, HEP, POC Person educated: PDoctor, general practice Explanation, Demonstration, and Handouts Education comprehension: verbalized understanding and returned demonstration     HOME EXERCISE PROGRAM: Access Code: [email or web address] URL: [email or web address] Date: 05/14/2022 Prepared by: DOctavio Manns  Exercises - Sit to Stand Without Arm Support  - 4 x weekly - 3 sets - 10 reps - Active Straight Leg Raise with Quad Set  - 4 x weekly - 3 sets - 10 reps - Sidelying Hip Abduction  - 4 x weekly - 3 sets - 10 reps - Clamshell with Resistance  - 4 x weekly - 3 sets - 10 reps - Supine Figure 4 Piriformis Stretch  - 1-2 x daily - 7 x weekly - 2-3 reps - 30 sec hold - Modified Thomas Stretch  - 1-2 x daily - 7 x weekly - 2-3 reps - 30 sec hold   ASSESSMENT: CLINICAL IMPRESSION: Pt presents to PT with  no reported pain. She tolerated all prescribed exercises well today. Session continues to focus on R hip strengthening, with no increase in pain, but noted fatigue. Pt able to progress with resistance and functional activities today with intermittent VC for proper forum throughout session. Pt will continue to benefit from skilled PT to address outlying deficits.    OBJECTIVE IMPAIRMENTS decreased activity tolerance, decreased mobility, difficulty walking, decreased ROM, decreased strength, and pain.    ACTIVITY LIMITATIONS sitting, standing, squatting, and stairs   PARTICIPATION LIMITATIONS: community activity,  occupation, and yard work   PERSONAL FACTORS 1-2 comorbidities: CKD, HTN  are also affecting patient's functional outcome.    REHAB POTENTIAL: Excellent   CLINICAL DECISION MAKING: Stable/uncomplicated   EVALUATION COMPLEXITY: Low     GOALS: Goals reviewed with patient? No   SHORT TERM GOALS: Target date: 06/04/2022  Pt will be compliant and knowledgeable with initial HEP for improved comfort and carryover Baseline: initial HEP given  Goal status: INITIAL   2.  Pt will self report R hip pain no greater than 5/10 for improved comfort and functional ability Baseline: 8/10 at worst Goal status: INITIAL   LONG TERM GOALS: Target date: 07/09/2022    Pt will self report R hip pain no greater than 1-2/10 for improved comfort and functional ability Baseline: 8/10 at worst Goal status: INITIAL   2.  Pt will improve FOTO function score to no less than 78% as proxy for functional improvement Baseline: 73% function Goal status: INITIAL   3.  Pt will improve bilateral LE MMT to no less than 4/5 for improved functional mobility and decrease pain Baseline: see chart Goal status: INITIAL   4.  Pt will improve R hip IR AROM to at least 25 degrees for improved comfort and function Baseline: 19 degrees  Goal status: INITIAL   PLAN: PT FREQUENCY: 1x/week   PT DURATION: 8 weeks   PLANNED INTERVENTIONS: Therapeutic exercises, Therapeutic activity, Neuromuscular re-education, Balance training, Gait training, Patient/Family education, Joint mobilization, Aquatic Therapy, Dry Needling, Electrical stimulation, Cryotherapy, Moist heat, Manual therapy, and Re-evaluation   PLAN FOR NEXT SESSION: assess HEP response, progress to standing activities.   Lynden Ang, PT 05/23/2022, 11:35 AM

## 2022-05-27 ENCOUNTER — Ambulatory Visit: Payer: Medicare HMO | Admitting: Physical Therapy

## 2022-05-27 ENCOUNTER — Encounter: Payer: Self-pay | Admitting: Physical Therapy

## 2022-05-27 DIAGNOSIS — M25551 Pain in right hip: Secondary | ICD-10-CM | POA: Diagnosis not present

## 2022-05-27 DIAGNOSIS — M6281 Muscle weakness (generalized): Secondary | ICD-10-CM

## 2022-05-28 DIAGNOSIS — I83893 Varicose veins of bilateral lower extremities with other complications: Secondary | ICD-10-CM | POA: Diagnosis not present

## 2022-05-29 NOTE — Therapy (Signed)
OUTPATIENT PHYSICAL THERAPY TREATMENT NOTE   Patient Name: Heather Taylor MRN: [id number] DOB:01-24-1951, 71 y.o., female Today's Date: 05/29/2022  PCP:  Holland Commons, FNP  REFERRING PROVIDER: Gregor Hams, MD  END OF SESSION:     Past Medical History:  Diagnosis Date   Arthritis    Fibroid    GERD (gastroesophageal reflux disease)    Past Surgical History:  Procedure Laterality Date   ABDOMINAL HYSTERECTOMY  1985   TAH   Patient Active Problem List   Diagnosis Date Noted   Chronic kidney disease, stage 3a ([place]) 05/05/2022   Long term (current) use of non-steroidal anti-inflammatories (nsaid) 05/05/2022   Gastroesophageal reflux disease 05/05/2022   History of asthma 05/05/2022   Penicillin allergy 05/05/2022   Prediabetes 05/05/2022   Primary hypertension 05/05/2022   Pure hypercholesterolemia 05/05/2022   Snoring 05/05/2022   Vitamin D deficiency 05/05/2022   Osteoarthritis 05/05/2022   Morbid obesity ([place]) 05/05/2022   Primary osteoarthritis of right hip 05/05/2022   Osteopenia    Arthritis     REFERRING DIAG: M25.551 (ICD-10-CM) - Right hip pain  THERAPY DIAG:  No diagnosis found.  Rationale for Evaluation and Treatment Rehabilitation  PERTINENT HISTORY: CKD, HTN  PRECAUTIONS: None  SUBJECTIVE: Pt reports to PT with no pain. She reports having a catch every now and then, but nothing like she had when she first started. Pt is appropriate and ready to start session at this time.   PAIN:  Are you having pain?  Yes: NPRS scale: 0/10 (8/10 at worst) Pain location: R hip, R groin Pain description: stiff, achy Aggravating factors: morning stiffness, prolonged standing Relieving factors: positioning, movement   OBJECTIVE:    DIAGNOSTIC FINDINGS:            See imagining   PATIENT SURVEYS:  FOTO: 73% function; 78% predicted    COGNITION:           Overall cognitive status: Within functional limits for tasks assessed                           SENSATION: WFL   MUSCLE LENGTH: Thomas test: Right (+) deg; Left (-) deg   POSTURE:  Medium body habitus; rounded shoulders, fwd head   PALPATION: TTP to R anterior/lateral hip   LOWER EXTREMITY ROM:      AROM Right 05/14/2022   Left 05/14/2022   Hip flexion       Hip extension      Hip abduction      Hip adduction      Hip internal rotation 19 p! 27  Hip external rotation 40 43  Knee flexion      Knee extension      Ankle dorsiflexion      Ankle plantarflexion      Ankle inversion      Ankle eversion                            (Blank rows = not tested)   LOWER EXTREMITY MMT:     MMT Right 05/14/2022  Left 05/14/2022   Hip flexion  3+/5 p! 3+/5  Hip extension      Hip abduction 3+/5 3+/5  Hip adduction      Hip external rotation      Hip internal rotation      Knee extension 5/5 5/5  Knee flexion 5/5 5/5  Ankle  dorsiflexion       Ankle plantarflexion      Ankle inversion      Ankle eversion      Grossly      (Blank rows = not tested)   LOWER EXTREMITY SPECIAL TESTS:  N/A   FUNCTIONAL TESTS:  30 Second Sit to Stand: 13 reps   GAIT: Distance walked: 25ft Assistive device utilized: None Level of assistance: Complete Independence Comments: slight R antalgia    TODAY'S TREATMENT: [facility] Adult PT Treatment:                                                DATE: 06/03/2022 Therapeutic Exercise: Treadmill 4 min at self paced speed.  Squatting with 10# weight.  2x10  Supine fig 4 stretch x 30" R Modified thomas x 30" R Side stepping BTB around knees 4 laps 10ft.  SLR 2 x 10 each with 4# Each  Standing hip abd/Ext  2 x 10 2# Each  S/L clamshell BTB x 20 each Bridges 20 x with BTB around knees. Cues for glute squeeze.  Seated marching 30 sec x 2 Hip adductor squeeze 5 sec hold x 15   Manual:  Long axis distraction   PATIENT EDUCATION:  Education details: eval findings, FOTO, HEP, POC Person educated: Patient/ Education method: Explanation,  Demonstration, and Handouts Education comprehension: verbalized understanding and returned demonstration     HOME EXERCISE PROGRAM: Access Code: [email or web address] URL: [email or web address] Date: 05/14/2022 Prepared by: David Stroup   Exercises - Sit to Stand Without Arm Support  - 4 x weekly - 3 sets - 10 reps - Active Straight Leg Raise with Quad Set  - 4 x weekly - 3 sets - 10 reps - Sidelying Hip Abduction  - 4 x weekly - 3 sets - 10 reps - Clamshell with Resistance  - 4 x weekly - 3 sets - 10 reps - Supine Figure 4 Piriformis Stretch  - 1-2 x daily - 7 x weekly - 2-3 reps - 30 sec hold - Modified Thomas Stretch  - 1-2 x daily - 7 x weekly - 2-3 reps - 30 sec hold   ASSESSMENT: CLINICAL IMPRESSION: Pt presents to PT with no reported pain. She tolerated all prescribed exercises well today. Progressed with weight and further challenged the pt with squats with 10# weight and resisted side stepping. Pt requires intermittent cues for controlled eccentric movements throughout session. Session continues to focus on R hip strengthening, with no increase in pain, but noted fatigue. Pt will continue to benefit from skilled PT to address outlying deficits.    OBJECTIVE IMPAIRMENTS decreased activity tolerance, decreased mobility, difficulty walking, decreased ROM, decreased strength, and pain.    ACTIVITY LIMITATIONS sitting, standing, squatting, and stairs   PARTICIPATION LIMITATIONS: community activity, occupation, and yard work   PERSONAL FACTORS 1-2 comorbidities: CKD, HTN  are also affecting patient's functional outcome.    REHAB POTENTIAL: Excellent   CLINICAL DECISION MAKING: Stable/uncomplicated   EVALUATION COMPLEXITY: Low     GOALS: Goals reviewed with patient? No   SHORT TERM GOALS: Target date: 06/04/2022  Pt will be compliant and knowledgeable with initial HEP for improved comfort and carryover Baseline: initial HEP given  Goal status: MET 06/03/2022  2.  Pt  will self report R hip pain no greater than 5/10 for improved comfort and functional ability Baseline:   8/10 at worst Goal status: MET 06/03/2022   LONG TERM GOALS: Target date: 07/09/2022    Pt will self report R hip pain no greater than 1-2/10 for improved comfort and functional ability Baseline: 8/10 at worst Goal status: INITIAL   2.  Pt will improve FOTO function score to no less than 78% as proxy for functional improvement Baseline: 73% function Goal status: INITIAL   3.  Pt will improve bilateral LE MMT to no less than 4/5 for improved functional mobility and decrease pain Baseline: see chart Goal status: INITIAL   4.  Pt will improve R hip IR AROM to at least 25 degrees for improved comfort and function Baseline: 19 degrees  Goal status: INITIAL   PLAN: PT FREQUENCY: 1x/week   PT DURATION: 8 weeks   PLANNED INTERVENTIONS: Therapeutic exercises, Therapeutic activity, Neuromuscular re-education, Balance training, Gait training, Patient/Family education, Joint mobilization, Aquatic Therapy, Dry Needling, Electrical stimulation, Cryotherapy, Moist heat, Manual therapy, and Re-evaluation   PLAN FOR NEXT SESSION: assess HEP response, assess response to progressions made.  Lynden Ang, PT 05/29/2022, 4:33 PM

## 2022-06-03 ENCOUNTER — Ambulatory Visit: Payer: Medicare HMO | Admitting: Physical Therapy

## 2022-06-03 ENCOUNTER — Encounter: Payer: Self-pay | Admitting: Physical Therapy

## 2022-06-03 DIAGNOSIS — M6281 Muscle weakness (generalized): Secondary | ICD-10-CM

## 2022-06-03 DIAGNOSIS — M25551 Pain in right hip: Secondary | ICD-10-CM

## 2022-06-09 DIAGNOSIS — M79605 Pain in left leg: Secondary | ICD-10-CM | POA: Diagnosis not present

## 2022-06-09 DIAGNOSIS — R600 Localized edema: Secondary | ICD-10-CM | POA: Diagnosis not present

## 2022-06-09 DIAGNOSIS — I872 Venous insufficiency (chronic) (peripheral): Secondary | ICD-10-CM | POA: Diagnosis not present

## 2022-06-09 DIAGNOSIS — M79604 Pain in right leg: Secondary | ICD-10-CM | POA: Diagnosis not present

## 2022-06-10 ENCOUNTER — Ambulatory Visit: Payer: Medicare HMO | Attending: Family Medicine

## 2022-06-10 DIAGNOSIS — M25551 Pain in right hip: Secondary | ICD-10-CM | POA: Diagnosis not present

## 2022-06-10 DIAGNOSIS — M6281 Muscle weakness (generalized): Secondary | ICD-10-CM | POA: Insufficient documentation

## 2022-06-10 NOTE — Therapy (Signed)
OUTPATIENT PHYSICAL THERAPY TREATMENT NOTE   Patient Name: Heather Taylor MRN: [id number] DOB:1950-12-23, 71 y.o., female Today's Date: 06/10/2022  PCP:  Holland Commons, FNP  REFERRING PROVIDER: Gregor Hams, MD  END OF SESSION:   PT End of Session - 06/10/22 1252     Visit Number 5    Number of Visits 9    Date for PT Re-Evaluation 07/09/22    Authorization Type [employer]    PT Start Time 1255    PT Stop Time 1335    PT Time Calculation (min) 40 min    Activity Tolerance Patient tolerated treatment well    Behavior During Therapy [facility] for tasks assessed/performed              Past Medical History:  Diagnosis Date   Arthritis    Fibroid    GERD (gastroesophageal reflux disease)    Past Surgical History:  Procedure Laterality Date   ABDOMINAL HYSTERECTOMY  1985   TAH   Patient Active Problem List   Diagnosis Date Noted   Chronic kidney disease, stage 3a ([place]) 05/05/2022   Long term (current) use of non-steroidal anti-inflammatories (nsaid) 05/05/2022   Gastroesophageal reflux disease 05/05/2022   History of asthma 05/05/2022   Penicillin allergy 05/05/2022   Prediabetes 05/05/2022   Primary hypertension 05/05/2022   Pure hypercholesterolemia 05/05/2022   Snoring 05/05/2022   Vitamin D deficiency 05/05/2022   Osteoarthritis 05/05/2022   Morbid obesity ([place]) 05/05/2022   Primary osteoarthritis of right hip 05/05/2022   Osteopenia    Arthritis     REFERRING DIAG: M25.551 (ICD-10-CM) - Right hip pain  THERAPY DIAG:  Pain in right hip  Muscle weakness (generalized)  Rationale for Evaluation and Treatment Rehabilitation  PERTINENT HISTORY: CKD, HTN  PRECAUTIONS: None  SUBJECTIVE: Patient reports that she goes to an exercise class 3 days a week and also has been walking more and has noticed her pain has decreased.   PAIN:  Are you having pain?  Yes: NPRS scale: 3/10 Pain location: R hip, R groin Pain description: stiff,  achy Aggravating factors: morning stiffness, prolonged standing Relieving factors: positioning, movement   OBJECTIVE:    DIAGNOSTIC FINDINGS:            See imagining   PATIENT SURVEYS:  FOTO: 73% function; 78% predicted    COGNITION:           Overall cognitive status: Within functional limits for tasks assessed                          SENSATION: WFL   MUSCLE LENGTH: Thomas test: Right (+) deg; Left (-) deg   POSTURE:  Medium body habitus; rounded shoulders, fwd head   PALPATION: TTP to R anterior/lateral hip   LOWER EXTREMITY ROM:      AROM Right 05/14/2022   Left 05/14/2022   Hip flexion       Hip extension      Hip abduction      Hip adduction      Hip internal rotation 19 p! 27  Hip external rotation 40 43  Knee flexion      Knee extension      Ankle dorsiflexion      Ankle plantarflexion      Ankle inversion      Ankle eversion                            (  Blank rows = not tested)   LOWER EXTREMITY MMT:     MMT Right 05/14/2022  Left 05/14/2022   Hip flexion  3+/5 p! 3+/5  Hip extension      Hip abduction 3+/5 3+/5  Hip adduction      Hip external rotation      Hip internal rotation      Knee extension 5/5 5/5  Knee flexion 5/5 5/5  Ankle dorsiflexion       Ankle plantarflexion      Ankle inversion      Ankle eversion      Grossly      (Blank rows = not tested)   LOWER EXTREMITY SPECIAL TESTS:  N/A   FUNCTIONAL TESTS:  30 Second Sit to Stand: 13 reps   GAIT: Distance walked: 75f Assistive device utilized: None Level of assistance: Complete Independence Comments: slight R antalgia    TODAY'S TREATMENT: [facility] Adult PT Treatment:                                                DATE: 06/10/2022 Therapeutic Exercise: Treadmill 5 min at self paced speed Deadlifting with 10# weight 2x10  Supine fig 4 stretch 2x30" R Modified thomas x 1' R Supine clamshell BlueTB 2x15 Omega knee extension 10# 2x10 Omega knee flexion 25# 2x10 Side stepping  BTB around knees 4 laps 166f SLR 2 x 10 each with 4# Each  Standing hip abd/Ext  2x10 each BIL BlueTB Bridges 20 x with BTB around knees. Cues for glute squeeze Seated marching 30 sec x 2 Hip adductor squeeze 5 sec hold x 15 with pilates ring   [facility] Adult PT Treatment:                                                DATE: 06/03/2022 Therapeutic Exercise: Treadmill 4 min at self paced speed.  Squatting with 10# weight.  2x10  Supine fig 4 stretch x 30" R Modified thomas x 30" R Side stepping BTB around knees 4 laps 1088f SLR 2 x 10 each with 4# Each  Standing hip abd/Ext  2 x 10 2# Each  S/L clamshell BTB x 20 each Bridges 20 x with BTB around knees. Cues for glute squeeze.  Seated marching 30 sec x 2 Hip adductor squeeze 5 sec hold x 15   Manual:  Long axis distraction   PATIENT EDUCATION:  Education details: eval findings, FOTO, HEP, POC Person educated: PatDoctor, general practicexplanation, Demonstration, and Handouts Education comprehension: verbalized understanding and returned demonstration     HOME EXERCISE PROGRAM: Access Code: [email or web address] URL: [email or web address] Date: 05/14/2022 Prepared by: DavOctavio MannsExercises - Sit to Stand Without Arm Support  - 4 x weekly - 3 sets - 10 reps - Active Straight Leg Raise with Quad Set  - 4 x weekly - 3 sets - 10 reps - Sidelying Hip Abduction  - 4 x weekly - 3 sets - 10 reps - Clamshell with Resistance  - 4 x weekly - 3 sets - 10 reps - Supine Figure 4 Piriformis Stretch  - 1-2 x daily - 7 x weekly - 2-3 reps - 30 sec hold - Modified [place]  1-2 x daily - 7 x weekly - 2-3 reps - 30 sec hold   ASSESSMENT: CLINICAL IMPRESSION: Patient presents to PT with mild pain in her R hip and reports it hasn't been high lately and she has been more active. Session today focused on proximal hip strengthening. Patient was able to tolerate all prescribed exercises with no adverse effects. Patient continues to  benefit from skilled PT services and should be progressed as able to improve functional independence.    OBJECTIVE IMPAIRMENTS decreased activity tolerance, decreased mobility, difficulty walking, decreased ROM, decreased strength, and pain.    ACTIVITY LIMITATIONS sitting, standing, squatting, and stairs   PARTICIPATION LIMITATIONS: community activity, occupation, and yard work   PERSONAL FACTORS 1-2 comorbidities: CKD, HTN  are also affecting patient's functional outcome.    REHAB POTENTIAL: Excellent   CLINICAL DECISION MAKING: Stable/uncomplicated   EVALUATION COMPLEXITY: Low     GOALS: Goals reviewed with patient? No   SHORT TERM GOALS: Target date: 06/04/2022  Pt will be compliant and knowledgeable with initial HEP for improved comfort and carryover Baseline: initial HEP given  Goal status: MET 06/03/2022  2.  Pt will self report R hip pain no greater than 5/10 for improved comfort and functional ability Baseline: 8/10 at worst Goal status: MET 06/03/2022   LONG TERM GOALS: Target date: 07/09/2022    Pt will self report R hip pain no greater than 1-2/10 for improved comfort and functional ability Baseline: 8/10 at worst Goal status: INITIAL   2.  Pt will improve FOTO function score to no less than 78% as proxy for functional improvement Baseline: 73% function Goal status: INITIAL   3.  Pt will improve bilateral LE MMT to no less than 4/5 for improved functional mobility and decrease pain Baseline: see chart Goal status: INITIAL   4.  Pt will improve R hip IR AROM to at least 25 degrees for improved comfort and function Baseline: 19 degrees  Goal status: INITIAL   PLAN: PT FREQUENCY: 1x/week   PT DURATION: 8 weeks   PLANNED INTERVENTIONS: Therapeutic exercises, Therapeutic activity, Neuromuscular re-education, Balance training, Gait training, Patient/Family education, Joint mobilization, Aquatic Therapy, Dry Needling, Electrical stimulation, Cryotherapy, Moist  heat, Manual therapy, and Re-evaluation   PLAN FOR NEXT SESSION: assess HEP response, assess response to progressions made.  Evelene Croon, PTA 06/10/2022, 1:35 PM

## 2022-06-17 DIAGNOSIS — N1831 Chronic kidney disease, stage 3a: Secondary | ICD-10-CM | POA: Diagnosis not present

## 2022-06-17 DIAGNOSIS — I10 Essential (primary) hypertension: Secondary | ICD-10-CM | POA: Diagnosis not present

## 2022-06-17 DIAGNOSIS — Z6836 Body mass index (BMI) 36.0-36.9, adult: Secondary | ICD-10-CM | POA: Diagnosis not present

## 2022-06-17 DIAGNOSIS — R600 Localized edema: Secondary | ICD-10-CM | POA: Diagnosis not present

## 2022-06-17 DIAGNOSIS — E7800 Pure hypercholesterolemia, unspecified: Secondary | ICD-10-CM | POA: Diagnosis not present

## 2022-06-17 DIAGNOSIS — F5101 Primary insomnia: Secondary | ICD-10-CM | POA: Diagnosis not present

## 2022-06-17 DIAGNOSIS — R7303 Prediabetes: Secondary | ICD-10-CM | POA: Diagnosis not present

## 2022-06-18 ENCOUNTER — Ambulatory Visit: Payer: Medicare HMO

## 2022-06-18 ENCOUNTER — Ambulatory Visit
Admission: RE | Admit: 2022-06-18 | Discharge: 2022-06-18 | Disposition: A | Discharge status: Home / Self Care | Payer: Medicare HMO | Source: Ambulatory Visit | Attending: Internal Medicine | Admitting: Internal Medicine

## 2022-06-18 DIAGNOSIS — M25551 Pain in right hip: Secondary | ICD-10-CM

## 2022-06-18 DIAGNOSIS — Z1231 Encounter for screening mammogram for malignant neoplasm of breast: Secondary | ICD-10-CM | POA: Diagnosis not present

## 2022-06-18 DIAGNOSIS — M6281 Muscle weakness (generalized): Secondary | ICD-10-CM | POA: Diagnosis not present

## 2022-06-18 NOTE — Therapy (Signed)
OUTPATIENT PHYSICAL THERAPY TREATMENT NOTE   Patient Name: Heather Taylor MRN: [id number] DOB:01/24/1951, 71 y.o., female Today's Date: 06/18/2022  PCP:  Holland Commons, FNP  REFERRING PROVIDER: Gregor Hams, MD  END OF SESSION:   PT End of Session - 06/18/22 1614     Visit Number 6    Number of Visits 9    Date for PT Re-Evaluation 07/09/22    Authorization Type [employer]    Authorization Time Period 12 visits approved 05/22/22-07/02/22    Authorization - Visit Number 4    Authorization - Number of Visits 12    PT Start Time 2585    PT Stop Time 1655    PT Time Calculation (min) 40 min    Activity Tolerance Patient tolerated treatment well    Behavior During Therapy [facility] for tasks assessed/performed               Past Medical History:  Diagnosis Date   Arthritis    Fibroid    GERD (gastroesophageal reflux disease)    Past Surgical History:  Procedure Laterality Date   ABDOMINAL HYSTERECTOMY  1985   TAH   Patient Active Problem List   Diagnosis Date Noted   Chronic kidney disease, stage 3a ([place]) 05/05/2022   Long term (current) use of non-steroidal anti-inflammatories (nsaid) 05/05/2022   Gastroesophageal reflux disease 05/05/2022   History of asthma 05/05/2022   Penicillin allergy 05/05/2022   Prediabetes 05/05/2022   Primary hypertension 05/05/2022   Pure hypercholesterolemia 05/05/2022   Snoring 05/05/2022   Vitamin D deficiency 05/05/2022   Osteoarthritis 05/05/2022   Morbid obesity ([place]) 05/05/2022   Primary osteoarthritis of right hip 05/05/2022   Osteopenia    Arthritis     REFERRING DIAG: M25.551 (ICD-10-CM) - Right hip pain  THERAPY DIAG:  Pain in right hip  Muscle weakness (generalized)  Rationale for Evaluation and Treatment Rehabilitation  PERTINENT HISTORY: CKD, HTN  PRECAUTIONS: None  SUBJECTIVE: Patient reports HEP compliance and that today she is feeling "excellent."  PAIN:  Are you having pain?  Yes: NPRS  scale: 5/10 Pain location: R hip, R groin Pain description: stiff, achy Aggravating factors: morning stiffness, prolonged standing Relieving factors: positioning, movement   OBJECTIVE:    DIAGNOSTIC FINDINGS:            See imagining   PATIENT SURVEYS:  FOTO: 73% function; 78% predicted    COGNITION:           Overall cognitive status: Within functional limits for tasks assessed                          SENSATION: WFL   MUSCLE LENGTH: Thomas test: Right (+) deg; Left (-) deg   POSTURE:  Medium body habitus; rounded shoulders, fwd head   PALPATION: TTP to R anterior/lateral hip   LOWER EXTREMITY ROM:      AROM Right 05/14/2022   Left 05/14/2022   Hip flexion       Hip extension      Hip abduction      Hip adduction      Hip internal rotation 19 p! 27  Hip external rotation 40 43  Knee flexion      Knee extension      Ankle dorsiflexion      Ankle plantarflexion      Ankle inversion      Ankle eversion                            (  Blank rows = not tested)   LOWER EXTREMITY MMT:     MMT Right 05/14/2022  Left 05/14/2022   Hip flexion  3+/5 p! 3+/5  Hip extension      Hip abduction 3+/5 3+/5  Hip adduction      Hip external rotation      Hip internal rotation      Knee extension 5/5 5/5  Knee flexion 5/5 5/5  Ankle dorsiflexion       Ankle plantarflexion      Ankle inversion      Ankle eversion      Grossly      (Blank rows = not tested)   LOWER EXTREMITY SPECIAL TESTS:  N/A   FUNCTIONAL TESTS:  30 Second Sit to Stand: 13 reps   GAIT: Distance walked: 13f Assistive device utilized: None Level of assistance: Complete Independence Comments: slight R antalgia    TODAY'S TREATMENT: [facility] Adult PT Treatment:                                                DATE: 06/18/2022 Therapeutic Exercise: Treadmill 5 min at self paced speed Deadlifting with 10# weight 2x10  Supine fig 4 stretch 2x30" R Modified thomas x 1' R Supine clamshell BlueTB 2x15 Omega  knee extension 10# 2x10 Omega knee flexion 25# 2x10 SLR 2 x 10 each with 4# Each  Cybex hip abduction/extension 17.5# 2x10 BIL Bridges 2x15 with BTB around knees. Cues for glute squeeze Hip adductor squeeze 5 sec hold 2x10 with pilates ring  [facility] Adult PT Treatment:                                                DATE: 06/10/2022 Therapeutic Exercise: Treadmill 5 min at self paced speed Deadlifting with 10# weight 2x10  Supine fig 4 stretch 2x30" R Modified thomas x 1' R Supine clamshell BlueTB 2x15 Omega knee extension 10# 2x10 Omega knee flexion 25# 2x10 Side stepping BTB around knees 4 laps 169f SLR 2 x 10 each with 4# Each  Standing hip abd/Ext  2x10 each BIL BlueTB Bridges 20 x with BTB around knees. Cues for glute squeeze Seated marching 30 sec x 2 Hip adductor squeeze 5 sec hold x 15 with pilates ring   [facility] Adult PT Treatment:                                                DATE: 06/03/2022 Therapeutic Exercise: Treadmill 4 min at self paced speed.  Squatting with 10# weight.  2x10  Supine fig 4 stretch x 30" R Modified thomas x 30" R Side stepping BTB around knees 4 laps 1074f SLR 2 x 10 each with 4# Each  Standing hip abd/Ext  2 x 10 2# Each  S/L clamshell BTB x 20 each Bridges 20 x with BTB around knees. Cues for glute squeeze.  Seated marching 30 sec x 2 Hip adductor squeeze 5 sec hold x 15   Manual:  Long axis distraction   PATIENT EDUCATION:  Education details: eval findings, FOTO, HEP, POC Person educated:  Patient/ Education method: Explanation, Demonstration, and Handouts Education comprehension: verbalized understanding and returned demonstration     HOME EXERCISE PROGRAM: Access Code: [email or web address] URL: [email or web address] Date: 05/14/2022 Prepared by: Octavio Manns   Exercises - Sit to Stand Without Arm Support  - 4 x weekly - 3 sets - 10 reps - Active Straight Leg Raise with Quad Set  - 4 x weekly - 3 sets - 10 reps - Sidelying Hip  Abduction  - 4 x weekly - 3 sets - 10 reps - Clamshell with Resistance  - 4 x weekly - 3 sets - 10 reps - Supine Figure 4 Piriformis Stretch  - 1-2 x daily - 7 x weekly - 2-3 reps - 30 sec hold - Modified Thomas Stretch  - 1-2 x daily - 7 x weekly - 2-3 reps - 30 sec hold   ASSESSMENT: CLINICAL IMPRESSION: Patient presents to PT with moderate pain in her R hip and reports she is going to get exercise classes 3x a week with no issue. Mentioned potentially DC soon as she has done well with therapy so far. She is interested in continuing at least until her next follow up MD appointment on 8/25. Session today focused on BIL proximal hip strengthening with increased resistance this session and no increase in pain. Patient continues to benefit from skilled PT services and should be progressed as able to improve functional independence.   OBJECTIVE IMPAIRMENTS decreased activity tolerance, decreased mobility, difficulty walking, decreased ROM, decreased strength, and pain.    ACTIVITY LIMITATIONS sitting, standing, squatting, and stairs   PARTICIPATION LIMITATIONS: community activity, occupation, and yard work   PERSONAL FACTORS 1-2 comorbidities: CKD, HTN  are also affecting patient's functional outcome.    REHAB POTENTIAL: Excellent   CLINICAL DECISION MAKING: Stable/uncomplicated   EVALUATION COMPLEXITY: Low     GOALS: Goals reviewed with patient? No   SHORT TERM GOALS: Target date: 06/04/2022  Pt will be compliant and knowledgeable with initial HEP for improved comfort and carryover Baseline: initial HEP given  Goal status: MET 06/03/2022  2.  Pt will self report R hip pain no greater than 5/10 for improved comfort and functional ability Baseline: 8/10 at worst Goal status: MET 06/03/2022   LONG TERM GOALS: Target date: 07/09/2022    Pt will self report R hip pain no greater than 1-2/10 for improved comfort and functional ability Baseline: 8/10 at worst Goal status: INITIAL   2.   Pt will improve FOTO function score to no less than 78% as proxy for functional improvement Baseline: 73% function Goal status: INITIAL   3.  Pt will improve bilateral LE MMT to no less than 4/5 for improved functional mobility and decrease pain Baseline: see chart Goal status: INITIAL   4.  Pt will improve R hip IR AROM to at least 25 degrees for improved comfort and function Baseline: 19 degrees  Goal status: INITIAL   PLAN: PT FREQUENCY: 1x/week   PT DURATION: 8 weeks   PLANNED INTERVENTIONS: Therapeutic exercises, Therapeutic activity, Neuromuscular re-education, Balance training, Gait training, Patient/Family education, Joint mobilization, Aquatic Therapy, Dry Needling, Electrical stimulation, Cryotherapy, Moist heat, Manual therapy, and Re-evaluation   PLAN FOR NEXT SESSION: assess HEP response, assess response to progressions made.  Margarette Canada, PTA 06/18/2022, 4:58 PM

## 2022-06-20 ENCOUNTER — Other Ambulatory Visit: Payer: Self-pay | Admitting: Internal Medicine

## 2022-06-20 DIAGNOSIS — R928 Other abnormal and inconclusive findings on diagnostic imaging of breast: Secondary | ICD-10-CM

## 2022-06-23 NOTE — Therapy (Unsigned)
OUTPATIENT PHYSICAL THERAPY TREATMENT NOTE   Patient Name: Heather Taylor MRN: [id number] DOB:Jun 29, 1951, 71 y.o., female Today's Date: 06/23/2022  PCP:  Holland Commons, FNP  REFERRING PROVIDER: Gregor Hams, MD  END OF SESSION:       Past Medical History:  Diagnosis Date   Arthritis    Fibroid    GERD (gastroesophageal reflux disease)    Past Surgical History:  Procedure Laterality Date   ABDOMINAL HYSTERECTOMY  1985   TAH   Patient Active Problem List   Diagnosis Date Noted   Chronic kidney disease, stage 3a ([place]) 05/05/2022   Long term (current) use of non-steroidal anti-inflammatories ([place]) 05/05/2022   Gastroesophageal reflux disease 05/05/2022   History of asthma 05/05/2022   Penicillin allergy 05/05/2022   Prediabetes 05/05/2022   Primary hypertension 05/05/2022   Pure hypercholesterolemia 05/05/2022   Snoring 05/05/2022   Vitamin D deficiency 05/05/2022   Osteoarthritis 05/05/2022   Morbid obesity ([place]) 05/05/2022   Primary osteoarthritis of right hip 05/05/2022   Osteopenia    Arthritis     REFERRING DIAG: M25.551 (ICD-10-CM) - Right hip pain  THERAPY DIAG:  No diagnosis found.  Rationale for Evaluation and Treatment Rehabilitation  PERTINENT HISTORY: CKD, HTN  PRECAUTIONS: None  SUBJECTIVE: Patient reports HEP compliance and that today she is feeling "excellent."  PAIN:  Are you having pain?  Yes: NPRS scale: 5/10 Pain location: R hip, R groin Pain description: stiff, achy Aggravating factors: morning stiffness, prolonged standing Relieving factors: positioning, movement   OBJECTIVE:    DIAGNOSTIC FINDINGS:            See imagining   PATIENT SURVEYS:  FOTO: 73% function; 78% predicted    COGNITION:           Overall cognitive status: Within functional limits for tasks assessed                          SENSATION: WFL   MUSCLE LENGTH: Thomas test: Right (+) deg; Left (-) deg   POSTURE:  Medium body habitus;  rounded shoulders, fwd head   PALPATION: TTP to R anterior/lateral hip   LOWER EXTREMITY ROM:      AROM Right 05/14/2022   Left 05/14/2022   Hip flexion       Hip extension      Hip abduction      Hip adduction      Hip internal rotation 19 p! 27  Hip external rotation 40 43  Knee flexion      Knee extension      Ankle dorsiflexion      Ankle plantarflexion      Ankle inversion      Ankle eversion                            (Blank rows = not tested)   LOWER EXTREMITY MMT:     MMT Right 05/14/2022  Left 05/14/2022   Hip flexion  3+/5 p! 3+/5  Hip extension      Hip abduction 3+/5 3+/5  Hip adduction      Hip external rotation      Hip internal rotation      Knee extension 5/5 5/5  Knee flexion 5/5 5/5  Ankle dorsiflexion       Ankle plantarflexion      Ankle inversion      Ankle eversion  Grossly      (Blank rows = not tested)   LOWER EXTREMITY SPECIAL TESTS:  N/A   FUNCTIONAL TESTS:  30 Second Sit to Stand: 13 reps   GAIT: Distance walked: 41f Assistive device utilized: None Level of assistance: Complete Independence Comments: slight R antalgia    TODAY'S TREATMENT: [facility] Adult PT Treatment:                                                DATE: 06/18/2022 Therapeutic Exercise: Treadmill 5 min at self paced speed Deadlifting with 10# weight 2x10  Supine fig 4 stretch 2x30" R Modified thomas x 1' R Supine clamshell BlueTB 2x15 Omega knee extension 10# 2x10 Omega knee flexion 25# 2x10 SLR 2 x 10 each with 4# Each  Cybex hip abduction/extension 17.5# 2x10 BIL Bridges 2x15 with BTB around knees. Cues for glute squeeze Hip adductor squeeze 5 sec hold 2x10 with pilates ring  [facility] Adult PT Treatment:                                                DATE: 06/10/2022 Therapeutic Exercise: Treadmill 5 min at self paced speed Deadlifting with 10# weight 2x10  Supine fig 4 stretch 2x30" R Modified thomas x 1' R Supine clamshell BlueTB 2x15 Omega knee extension  10# 2x10 Omega knee flexion 25# 2x10 Side stepping BTB around knees 4 laps 160f SLR 2 x 10 each with 4# Each  Standing hip abd/Ext  2x10 each BIL BlueTB Bridges 20 x with BTB around knees. Cues for glute squeeze Seated marching 30 sec x 2 Hip adductor squeeze 5 sec hold x 15 with pilates ring   [facility] Adult PT Treatment:                                                DATE: 06/03/2022 Therapeutic Exercise: Treadmill 4 min at self paced speed.  Squatting with 10# weight.  2x10  Supine fig 4 stretch x 30" R Modified thomas x 30" R Side stepping BTB around knees 4 laps 1030f SLR 2 x 10 each with 4# Each  Standing hip abd/Ext  2 x 10 2# Each  S/L clamshell BTB x 20 each Bridges 20 x with BTB around knees. Cues for glute squeeze.  Seated marching 30 sec x 2 Hip adductor squeeze 5 sec hold x 15   Manual:  Long axis distraction   PATIENT EDUCATION:  Education details: eval findings, FOTO, HEP, POC Person educated: PatDoctor, general practicexplanation, Demonstration, and Handouts Education comprehension: verbalized understanding and returned demonstration     HOME EXERCISE PROGRAM: Access Code: [email or web address] URL: [email or web address] Date: 05/14/2022 Prepared by: DavOctavio MannsExercises - Sit to Stand Without Arm Support  - 4 x weekly - 3 sets - 10 reps - Active Straight Leg Raise with Quad Set  - 4 x weekly - 3 sets - 10 reps - Sidelying Hip Abduction  - 4 x weekly - 3 sets - 10 reps - Clamshell with Resistance  - 4 x weekly - 3 sets - 10 reps -  Supine Figure 4 Piriformis Stretch  - 1-2 x daily - 7 x weekly - 2-3 reps - 30 sec hold - Modified Thomas Stretch  - 1-2 x daily - 7 x weekly - 2-3 reps - 30 sec hold   ASSESSMENT: CLINICAL IMPRESSION: Patient presents to PT with moderate pain in her R hip and reports she is going to get exercise classes 3x a week with no issue. Mentioned potentially DC soon as she has done well with therapy so far. She is interested in  continuing at least until her next follow up MD appointment on 8/25. Session today focused on BIL proximal hip strengthening with increased resistance this session and no increase in pain. Patient continues to benefit from skilled PT services and should be progressed as able to improve functional independence.   OBJECTIVE IMPAIRMENTS decreased activity tolerance, decreased mobility, difficulty walking, decreased ROM, decreased strength, and pain.    ACTIVITY LIMITATIONS sitting, standing, squatting, and stairs   PARTICIPATION LIMITATIONS: community activity, occupation, and yard work   PERSONAL FACTORS 1-2 comorbidities: CKD, HTN  are also affecting patient's functional outcome.    REHAB POTENTIAL: Excellent   CLINICAL DECISION MAKING: Stable/uncomplicated   EVALUATION COMPLEXITY: Low     GOALS: Goals reviewed with patient? No   SHORT TERM GOALS: Target date: 06/04/2022  Pt will be compliant and knowledgeable with initial HEP for improved comfort and carryover Baseline: initial HEP given  Goal status: MET 06/03/2022  2.  Pt will self report R hip pain no greater than 5/10 for improved comfort and functional ability Baseline: 8/10 at worst Goal status: MET 06/03/2022   LONG TERM GOALS: Target date: 07/09/2022    Pt will self report R hip pain no greater than 1-2/10 for improved comfort and functional ability Baseline: 8/10 at worst Goal status: INITIAL   2.  Pt will improve FOTO function score to no less than 78% as proxy for functional improvement Baseline: 73% function Goal status: INITIAL   3.  Pt will improve bilateral LE MMT to no less than 4/5 for improved functional mobility and decrease pain Baseline: see chart Goal status: INITIAL   4.  Pt will improve R hip IR AROM to at least 25 degrees for improved comfort and function Baseline: 19 degrees  Goal status: INITIAL   PLAN: PT FREQUENCY: 1x/week   PT DURATION: 8 weeks   PLANNED INTERVENTIONS: Therapeutic  exercises, Therapeutic activity, Neuromuscular re-education, Balance training, Gait training, Patient/Family education, Joint mobilization, Aquatic Therapy, Dry Needling, Electrical stimulation, Cryotherapy, Moist heat, Manual therapy, and Re-evaluation   PLAN FOR NEXT SESSION: assess HEP response, assess response to progressions made.  Lynden Ang, PT 06/23/2022, 1:10 PM

## 2022-06-24 ENCOUNTER — Encounter: Payer: Self-pay | Admitting: Physical Therapy

## 2022-06-24 ENCOUNTER — Ambulatory Visit: Payer: Medicare HMO | Admitting: Physical Therapy

## 2022-06-24 DIAGNOSIS — M6281 Muscle weakness (generalized): Secondary | ICD-10-CM

## 2022-06-24 DIAGNOSIS — M25551 Pain in right hip: Secondary | ICD-10-CM

## 2022-07-02 ENCOUNTER — Ambulatory Visit
Admission: RE | Admit: 2022-07-02 | Discharge: 2022-07-02 | Disposition: A | Discharge status: Home / Self Care | Payer: Medicare HMO | Source: Ambulatory Visit | Attending: Internal Medicine | Admitting: Internal Medicine

## 2022-07-02 ENCOUNTER — Other Ambulatory Visit: Payer: Self-pay | Admitting: Internal Medicine

## 2022-07-02 DIAGNOSIS — N6310 Unspecified lump in the right breast, unspecified quadrant: Secondary | ICD-10-CM

## 2022-07-02 DIAGNOSIS — R928 Other abnormal and inconclusive findings on diagnostic imaging of breast: Secondary | ICD-10-CM

## 2022-07-02 DIAGNOSIS — N6311 Unspecified lump in the right breast, upper outer quadrant: Secondary | ICD-10-CM | POA: Diagnosis not present

## 2022-07-03 NOTE — Progress Notes (Signed)
   I, Peterson Lombard, LAT, ATC acting as a scribe for Lynne Leader, MD.  Heather Taylor is a 71 y.o. female who presents to [facility] today for f/u R hip pain. Pt was last seen by Dr. Georgina Snell 05/05/22 and was referred to PT, completing 7 visits, and being d/c on 06/24/22. Today, pt reports R hip is feeling wonderful and she is no longer having any R hip pain.  Her physical therapist was Rudi Heap PT at [facility].   Pertinent review of systems: No fevers or chills  Relevant historical information: Hypertension, right hip arthritis ,  Exam:  BP 132/78   Pulse 83   Ht '5\' 1"'$  (1.549 m)   Wt 191 lb 9.6 oz (86.9 kg)   LMP 04/10/1984   SpO2 98%   BMI 36.20 kg/m  General: Well Developed, well nourished, and in no acute distress.   MSK: Right hip normal motion.  Normal gait.   Assessment and Plan: 71 y.o. female with right hip pain thought to be primarily due to hip arthritis seen on prior x-ray.  She has had dramatic improvement with physical therapy and has been released to home exercise program.  This is an excellent outcome.  At this point she should continue home exercise program and check back as needed.  We can get her back in with PT in the future if needed.  Additionally we could do injections in the future and it is likely that at some point in her life she may require hip replacement but for now all is well.  Total encounter time 20 minutes including face-to-face time with the patient and, reviewing past medical record, and charting on the date of service.   Discussion treatment plan and options going forward.  Discussed warning signs or symptoms. Please see discharge instructions. Patient expresses understanding.   The above documentation has been reviewed and is accurate and complete Lynne Leader, M.D.

## 2022-07-04 ENCOUNTER — Ambulatory Visit: Payer: Medicare HMO | Admitting: Family Medicine

## 2022-07-04 VITALS — BP 132/78 | HR 83 | Ht 61.0 in | Wt 191.6 lb

## 2022-07-04 DIAGNOSIS — M25551 Pain in right hip: Secondary | ICD-10-CM | POA: Diagnosis not present

## 2022-07-04 DIAGNOSIS — M1611 Unilateral primary osteoarthritis, right hip: Secondary | ICD-10-CM

## 2022-07-04 NOTE — Patient Instructions (Signed)
Thank you for coming in today.   So glad you are feeling better!  Check back with me as needed

## 2022-07-08 ENCOUNTER — Ambulatory Visit
Admission: RE | Admit: 2022-07-08 | Discharge: 2022-07-08 | Disposition: A | Discharge status: Home / Self Care | Payer: Medicare HMO | Source: Ambulatory Visit | Attending: Internal Medicine | Admitting: Internal Medicine

## 2022-07-08 ENCOUNTER — Ambulatory Visit: Payer: Medicare HMO

## 2022-07-08 DIAGNOSIS — N641 Fat necrosis of breast: Secondary | ICD-10-CM | POA: Diagnosis not present

## 2022-07-08 DIAGNOSIS — N6310 Unspecified lump in the right breast, unspecified quadrant: Secondary | ICD-10-CM

## 2022-07-08 DIAGNOSIS — N6311 Unspecified lump in the right breast, upper outer quadrant: Secondary | ICD-10-CM | POA: Diagnosis not present

## 2022-07-08 HISTORY — PX: BREAST BIOPSY: SHX20

## 2022-08-09 IMAGING — MG MM DIGITAL SCREENING BILAT W/ TOMO AND CAD
8 series · 8 of 24 positions shown · non-contrast
Comparison: Previous exam(s).

CLINICAL DATA: Screening.

EXAM:
DIGITAL SCREENING BILATERAL MAMMOGRAM WITH TOMOSYNTHESIS AND CAD
TECHNIQUE: Bilateral screening digital craniocaudal and mediolateral oblique
mammograms were obtained. Bilateral screening digital breast
tomosynthesis was performed. The images were evaluated with
computer-aided detection.

[L CC synth-2D]
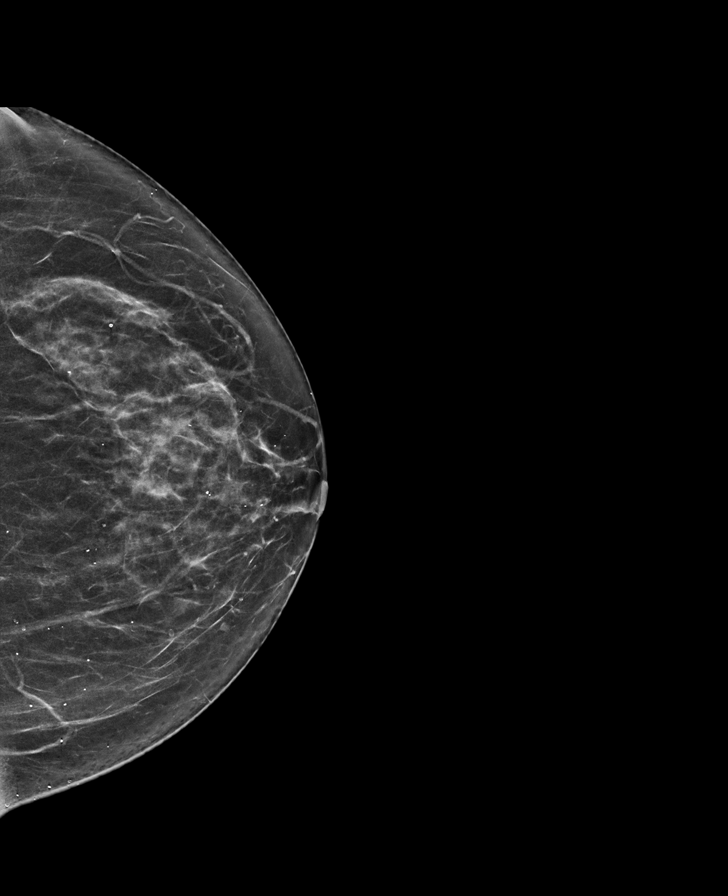

[L MLO synth-2D]
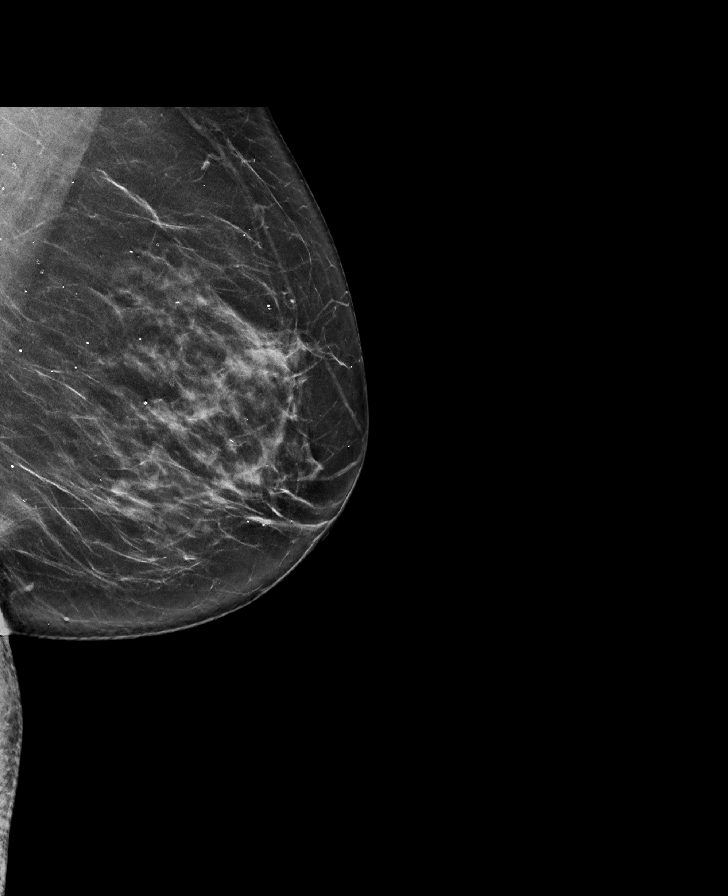

[R CC synth-2D]
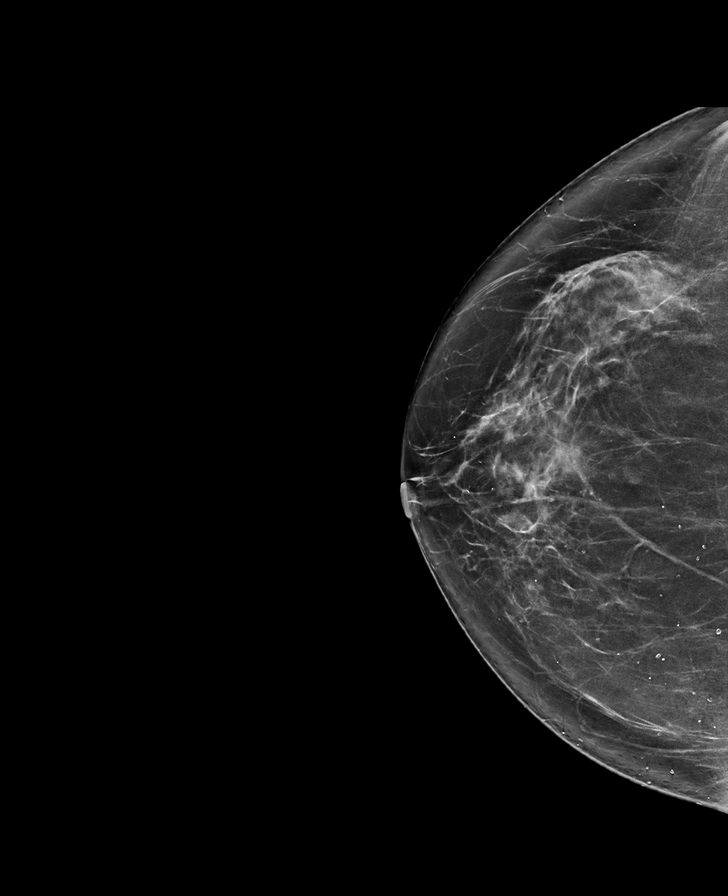

[R MLO synth-2D]
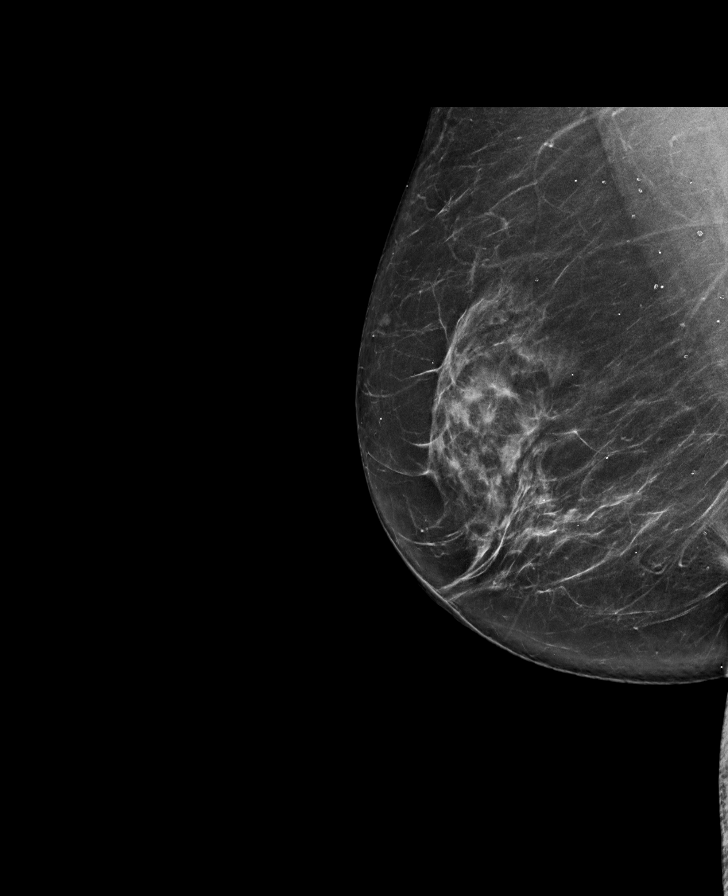

[R MLO tomo · tomo slice 43/84.0]
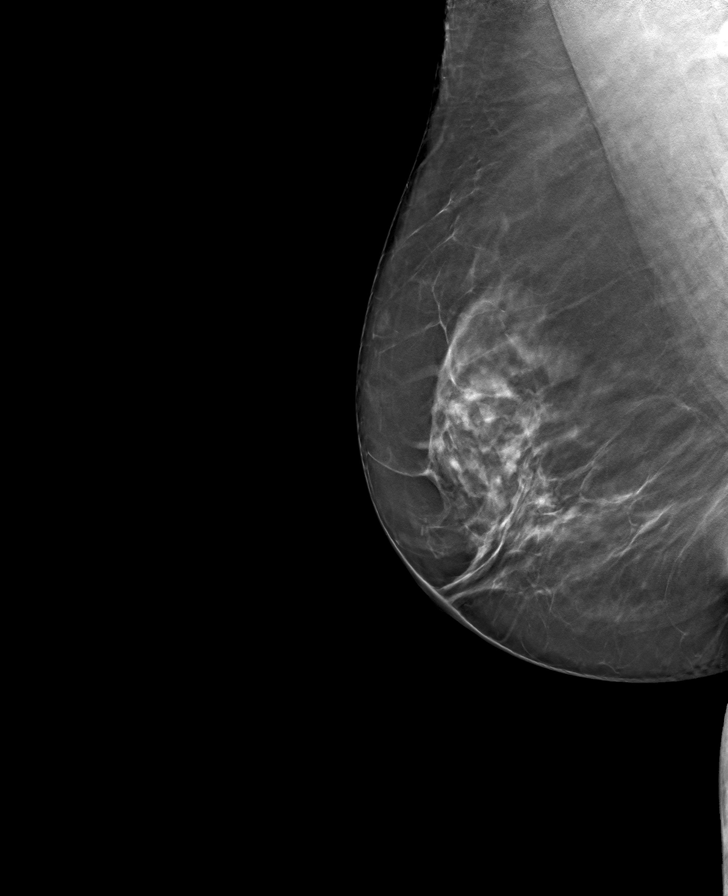

[L MLO tomo · tomo slice 40/79.0]
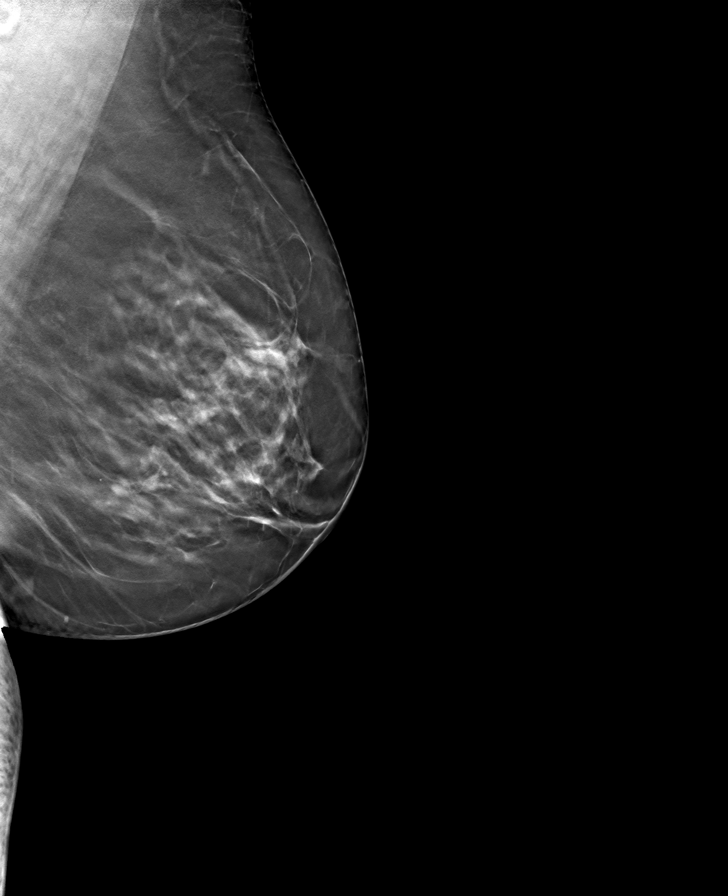

[R CC tomo · tomo slice 38/75.0]
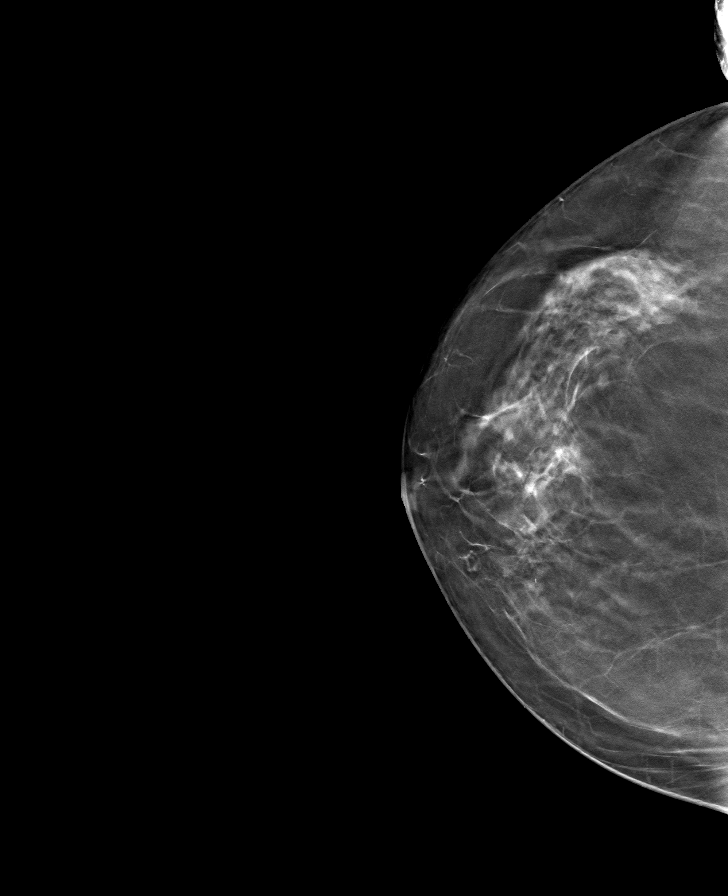

[L CC tomo · tomo slice 35/69.0]
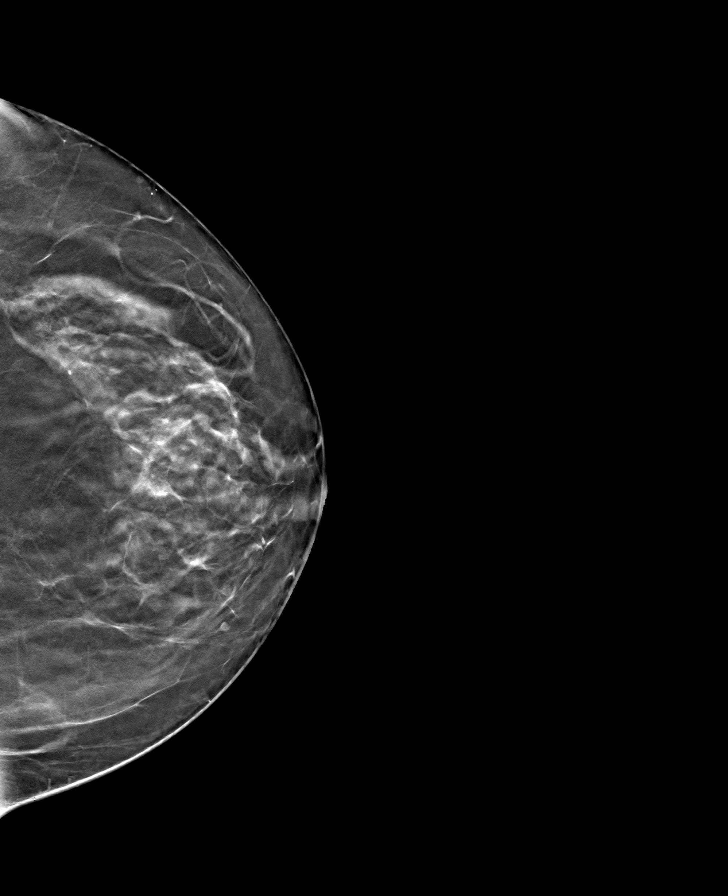

[8 of 24 positions shown; findings below may reference images not displayed]

ACR Breast Density Category c: The breast tissue is heterogeneously
dense, which may obscure small masses.
FINDINGS: There are no findings suspicious for malignancy.
IMPRESSION: No mammographic evidence of malignancy. A result letter of this
screening mammogram will be mailed directly to the patient.

RECOMMENDATION:
Screening mammogram in one year. (Code:[id number])

BI-RADS CATEGORY  1: Negative.

## 2022-09-04 DIAGNOSIS — E7800 Pure hypercholesterolemia, unspecified: Secondary | ICD-10-CM | POA: Diagnosis not present

## 2022-09-04 DIAGNOSIS — R7303 Prediabetes: Secondary | ICD-10-CM | POA: Diagnosis not present

## 2022-09-08 ENCOUNTER — Other Ambulatory Visit: Payer: Self-pay | Admitting: Internal Medicine

## 2022-09-08 ENCOUNTER — Other Ambulatory Visit (HOSPITAL_COMMUNITY): Payer: Self-pay | Admitting: Internal Medicine

## 2022-09-08 DIAGNOSIS — Z0000 Encounter for general adult medical examination without abnormal findings: Secondary | ICD-10-CM | POA: Diagnosis not present

## 2022-09-08 DIAGNOSIS — N1831 Chronic kidney disease, stage 3a: Secondary | ICD-10-CM | POA: Diagnosis not present

## 2022-09-08 DIAGNOSIS — I10 Essential (primary) hypertension: Secondary | ICD-10-CM | POA: Diagnosis not present

## 2022-09-08 DIAGNOSIS — E7800 Pure hypercholesterolemia, unspecified: Secondary | ICD-10-CM | POA: Diagnosis not present

## 2022-09-08 DIAGNOSIS — K219 Gastro-esophageal reflux disease without esophagitis: Secondary | ICD-10-CM | POA: Diagnosis not present

## 2022-09-08 DIAGNOSIS — E559 Vitamin D deficiency, unspecified: Secondary | ICD-10-CM | POA: Diagnosis not present

## 2022-09-08 DIAGNOSIS — M8580 Other specified disorders of bone density and structure, unspecified site: Secondary | ICD-10-CM | POA: Diagnosis not present

## 2022-09-08 DIAGNOSIS — R7303 Prediabetes: Secondary | ICD-10-CM | POA: Diagnosis not present

## 2022-09-08 DIAGNOSIS — D509 Iron deficiency anemia, unspecified: Secondary | ICD-10-CM | POA: Diagnosis not present

## 2022-09-08 DIAGNOSIS — R5382 Chronic fatigue, unspecified: Secondary | ICD-10-CM | POA: Diagnosis not present

## 2022-09-15 DIAGNOSIS — E611 Iron deficiency: Secondary | ICD-10-CM | POA: Diagnosis not present

## 2022-09-17 DIAGNOSIS — H2513 Age-related nuclear cataract, bilateral: Secondary | ICD-10-CM | POA: Diagnosis not present

## 2022-09-17 DIAGNOSIS — H524 Presbyopia: Secondary | ICD-10-CM | POA: Diagnosis not present

## 2022-09-17 DIAGNOSIS — H43812 Vitreous degeneration, left eye: Secondary | ICD-10-CM | POA: Diagnosis not present

## 2022-09-22 ENCOUNTER — Ambulatory Visit (HOSPITAL_COMMUNITY)
Admission: RE | Admit: 2022-09-22 | Discharge: 2022-09-22 | Disposition: A | Discharge status: Home / Self Care | Payer: Medicare HMO | Source: Ambulatory Visit | Attending: Internal Medicine | Admitting: Internal Medicine

## 2022-09-22 DIAGNOSIS — I10 Essential (primary) hypertension: Secondary | ICD-10-CM

## 2022-10-08 DIAGNOSIS — K760 Fatty (change of) liver, not elsewhere classified: Secondary | ICD-10-CM | POA: Diagnosis not present

## 2022-10-10 DIAGNOSIS — H33311 Horseshoe tear of retina without detachment, right eye: Secondary | ICD-10-CM | POA: Diagnosis not present

## 2022-10-10 DIAGNOSIS — H43813 Vitreous degeneration, bilateral: Secondary | ICD-10-CM | POA: Diagnosis not present

## 2022-10-10 DIAGNOSIS — H2513 Age-related nuclear cataract, bilateral: Secondary | ICD-10-CM | POA: Diagnosis not present

## 2023-02-20 DIAGNOSIS — H43813 Vitreous degeneration, bilateral: Secondary | ICD-10-CM | POA: Diagnosis not present

## 2023-02-20 DIAGNOSIS — H33311 Horseshoe tear of retina without detachment, right eye: Secondary | ICD-10-CM | POA: Diagnosis not present

## 2023-02-20 DIAGNOSIS — H2513 Age-related nuclear cataract, bilateral: Secondary | ICD-10-CM | POA: Diagnosis not present

## 2023-03-10 DIAGNOSIS — I10 Essential (primary) hypertension: Secondary | ICD-10-CM | POA: Diagnosis not present

## 2023-03-10 DIAGNOSIS — R7303 Prediabetes: Secondary | ICD-10-CM | POA: Diagnosis not present

## 2023-03-10 DIAGNOSIS — M8580 Other specified disorders of bone density and structure, unspecified site: Secondary | ICD-10-CM | POA: Diagnosis not present

## 2023-03-10 DIAGNOSIS — M8589 Other specified disorders of bone density and structure, multiple sites: Secondary | ICD-10-CM | POA: Diagnosis not present

## 2023-04-20 DIAGNOSIS — R0609 Other forms of dyspnea: Secondary | ICD-10-CM | POA: Diagnosis not present

## 2023-04-20 DIAGNOSIS — I10 Essential (primary) hypertension: Secondary | ICD-10-CM | POA: Diagnosis not present

## 2023-04-20 DIAGNOSIS — R051 Acute cough: Secondary | ICD-10-CM | POA: Diagnosis not present

## 2023-05-04 DIAGNOSIS — R042 Hemoptysis: Secondary | ICD-10-CM | POA: Diagnosis not present

## 2023-05-04 DIAGNOSIS — R5383 Other fatigue: Secondary | ICD-10-CM | POA: Diagnosis not present

## 2023-05-04 DIAGNOSIS — I10 Essential (primary) hypertension: Secondary | ICD-10-CM | POA: Diagnosis not present

## 2023-05-04 DIAGNOSIS — J4521 Mild intermittent asthma with (acute) exacerbation: Secondary | ICD-10-CM | POA: Diagnosis not present

## 2023-06-22 ENCOUNTER — Other Ambulatory Visit: Payer: Self-pay | Admitting: Internal Medicine

## 2023-06-22 DIAGNOSIS — Z1231 Encounter for screening mammogram for malignant neoplasm of breast: Secondary | ICD-10-CM

## 2023-07-16 ENCOUNTER — Ambulatory Visit
Admission: RE | Admit: 2023-07-16 | Discharge: 2023-07-16 | Disposition: A | Discharge status: Home / Self Care | Payer: Medicare HMO | Source: Ambulatory Visit | Attending: Internal Medicine | Admitting: Internal Medicine

## 2023-07-16 DIAGNOSIS — Z1231 Encounter for screening mammogram for malignant neoplasm of breast: Secondary | ICD-10-CM | POA: Diagnosis not present

## 2023-09-17 ENCOUNTER — Encounter: Payer: Self-pay | Admitting: Nurse Practitioner

## 2023-09-17 ENCOUNTER — Ambulatory Visit (INDEPENDENT_AMBULATORY_CARE_PROVIDER_SITE_OTHER): Payer: Medicare HMO | Admitting: Nurse Practitioner

## 2023-09-17 ENCOUNTER — Other Ambulatory Visit: Payer: Self-pay

## 2023-09-17 VITALS — BP 138/78 | HR 87 | Ht 61.02 in | Wt 188.0 lb

## 2023-09-17 DIAGNOSIS — M8589 Other specified disorders of bone density and structure, multiple sites: Secondary | ICD-10-CM | POA: Diagnosis not present

## 2023-09-17 DIAGNOSIS — Z780 Asymptomatic menopausal state: Secondary | ICD-10-CM

## 2023-09-17 DIAGNOSIS — I10 Essential (primary) hypertension: Secondary | ICD-10-CM | POA: Diagnosis not present

## 2023-09-17 DIAGNOSIS — Z01419 Encounter for gynecological examination (general) (routine) without abnormal findings: Secondary | ICD-10-CM

## 2023-09-17 NOTE — Progress Notes (Signed)
Heather Taylor [birth year] [id number]   History:  72 y.o. G0 presents for breast and pelvic exam. No GYN complaints. Postmenopausal - no HRT. S/P 1985 TAH for fibroids. Normal pap history. Osteopenia, OA. HTN managed by PCP. Reports stopping meds on her own. Checks a few times per week and ranges from 124/80s-138/80s usually.   Gynecologic History Patient's last menstrual period was 04/10/1984.   Contraception: status post hysterectomy Sexually active: No  Health Maintenance Last Pap: 10/01/2012 Last mammogram: 07/16/2023. Results were: Normal Last colonoscopy: 12/12/2016. Results were: Tubular adenoma, 5-year recall Last Dexa: 09/04/2021. Results were: T-score -1.6, FRAX 3.9% / 0.4%  Past medical history, past surgical history, [name] Heather Taylor. Retired. [employer] 72 yo a few hours each day, she is in rehab right now after a fall. 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  ROS:  A ROS was performed and pertinent positives and negatives are included.  Exam:  Vitals:   09/17/23 0846 09/17/23 0923  BP: (!) 142/82 138/78  Pulse: 87   SpO2: 96%   Weight: 188 lb (85.3 kg)   Height: 5' 1.02" (1.55 m)     Body mass index is 35.49 kg/m.  General appearance:  Normal Thyroid:  Symmetrical, normal in size, without palpable masses or nodularity. Respiratory  Auscultation:  Clear without wheezing or rhonchi Cardiovascular  Auscultation:  Regular rate, without rubs, murmurs or gallops  Edema/varicosities:  Not grossly evident Abdominal  Soft,nontender, without masses, guarding or rebound.  Liver/spleen:  No organomegaly noted  Hernia:  None appreciated  Skin  Inspection:  Grossly normal Breasts: Examined lying and sitting.   Right: Without masses, retractions, nipple discharge or axillary adenopathy.   Left: Without masses, retractions, nipple discharge or axillary adenopathy. Pelvic: External genitalia:  no lesions               Urethra:  normal appearing urethra with no masses, tenderness or lesions              Bartholins and Skenes: normal                 Vagina: normal appearing vagina with normal color and discharge, no lesions              Cervix: absent Bimanual Exam:  Uterus:  absent              Adnexa: no mass, fullness, tenderness              Rectovaginal: Deferred              Anus:  normal, no lesions  Patient informed chaperone available to be present for breast and pelvic exam. Patient has requested no chaperone to be present. Patient has been advised what will be completed during breast and pelvic exam.   Assessment/Plan:  72 y.o. G0 for breast and pelvic exam.   Encounter for breast and pelvic examination - Education provided on SBEs, importance of preventative screenings, current guidelines, high calcium diet, regular exercise, and multivitamin daily.  Labs with PCP.  Postmenopausal - No HRT, no bleeding. S/P 1985 TAH for fibroids.  Osteopenia of multiple sites - Plan: DG Bone Density. 08/2021 T-score -1.6 without elevated FRAX. Continue Vitamin D + Calcium and regular exercise. She goes to gym and walks most days. Will schedule DXA at [facility].   Elevated blood pressure reading in office with diagnosis of hypertension - 142/82 at start of visit,  repeat at end of visit 138/78. Keep blood pressure diary, check morning and evening and discuss with PCP next month.   Screening for cervical cancer - Normal Pap history. No longer screening per guidelines.   Screening for breast cancer - Normal mammogram history.  Continue annual screenings.  Normal breast exam today.  Screening for colon cancer - 2018 colonoscopy. Overdue and plans to coordinate with PCP next month.    Return in about 2 years (around 09/16/2025) for B&P.    Heather Mackie DNP, 9:25 AM 09/17/2023

## 2023-09-29 DIAGNOSIS — H5213 Myopia, bilateral: Secondary | ICD-10-CM | POA: Diagnosis not present

## 2023-09-29 DIAGNOSIS — H2513 Age-related nuclear cataract, bilateral: Secondary | ICD-10-CM | POA: Diagnosis not present

## 2023-09-29 DIAGNOSIS — H35413 Lattice degeneration of retina, bilateral: Secondary | ICD-10-CM | POA: Diagnosis not present

## 2023-09-29 DIAGNOSIS — Z0100 Encounter for examination of eyes and vision without abnormal findings: Secondary | ICD-10-CM | POA: Diagnosis not present

## 2023-09-29 DIAGNOSIS — H52223 Regular astigmatism, bilateral: Secondary | ICD-10-CM | POA: Diagnosis not present

## 2023-09-29 DIAGNOSIS — H59813 Chorioretinal scars after surgery for detachment, bilateral: Secondary | ICD-10-CM | POA: Diagnosis not present

## 2023-09-29 DIAGNOSIS — H524 Presbyopia: Secondary | ICD-10-CM | POA: Diagnosis not present

## 2023-10-12 DIAGNOSIS — R7303 Prediabetes: Secondary | ICD-10-CM | POA: Diagnosis not present

## 2023-10-12 DIAGNOSIS — E7800 Pure hypercholesterolemia, unspecified: Secondary | ICD-10-CM | POA: Diagnosis not present

## 2023-10-19 DIAGNOSIS — K219 Gastro-esophageal reflux disease without esophagitis: Secondary | ICD-10-CM | POA: Diagnosis not present

## 2023-10-19 DIAGNOSIS — Z23 Encounter for immunization: Secondary | ICD-10-CM | POA: Diagnosis not present

## 2023-10-19 DIAGNOSIS — M8580 Other specified disorders of bone density and structure, unspecified site: Secondary | ICD-10-CM | POA: Diagnosis not present

## 2023-10-19 DIAGNOSIS — R7303 Prediabetes: Secondary | ICD-10-CM | POA: Diagnosis not present

## 2023-10-19 DIAGNOSIS — N3000 Acute cystitis without hematuria: Secondary | ICD-10-CM | POA: Diagnosis not present

## 2023-10-19 DIAGNOSIS — I10 Essential (primary) hypertension: Secondary | ICD-10-CM | POA: Diagnosis not present

## 2023-10-19 DIAGNOSIS — Z0000 Encounter for general adult medical examination without abnormal findings: Secondary | ICD-10-CM | POA: Diagnosis not present

## 2023-10-19 DIAGNOSIS — N1831 Chronic kidney disease, stage 3a: Secondary | ICD-10-CM | POA: Diagnosis not present

## 2023-10-19 DIAGNOSIS — I2510 Atherosclerotic heart disease of native coronary artery without angina pectoris: Secondary | ICD-10-CM | POA: Diagnosis not present

## 2023-11-16 ENCOUNTER — Encounter: Payer: Self-pay | Admitting: Gastroenterology

## 2023-12-07 ENCOUNTER — Ambulatory Visit (AMBULATORY_SURGERY_CENTER): Payer: Medicare Other

## 2023-12-07 ENCOUNTER — Other Ambulatory Visit: Payer: Self-pay

## 2023-12-07 VITALS — Ht 61.0 in | Wt 188.0 lb

## 2023-12-07 DIAGNOSIS — Z860100 Personal history of colon polyps, unspecified: Secondary | ICD-10-CM

## 2023-12-07 DIAGNOSIS — Z0000 Encounter for general adult medical examination without abnormal findings: Secondary | ICD-10-CM | POA: Diagnosis not present

## 2023-12-07 MED ORDER — SUFLAVE 178.7 G PO SOLR: 1.0000 | ORAL | 0 refills | Status: AC

## 2023-12-07 NOTE — Progress Notes (Signed)
Denies allergies to eggs or soy products. Denies complication of anesthesia or sedation. Denies use of weight loss medication. Denies use of O2.   Emmi instructions given for colonoscopy.

## 2023-12-08 DIAGNOSIS — R7303 Prediabetes: Secondary | ICD-10-CM | POA: Diagnosis not present

## 2023-12-08 DIAGNOSIS — I10 Essential (primary) hypertension: Secondary | ICD-10-CM | POA: Diagnosis not present

## 2023-12-10 ENCOUNTER — Encounter: Payer: Self-pay | Admitting: Gastroenterology

## 2023-12-17 ENCOUNTER — Encounter: Payer: Medicare HMO | Admitting: Gastroenterology

## 2023-12-21 ENCOUNTER — Ambulatory Visit (AMBULATORY_SURGERY_CENTER): Payer: Medicare Other | Admitting: Gastroenterology

## 2023-12-21 ENCOUNTER — Encounter: Payer: Self-pay | Admitting: Gastroenterology

## 2023-12-21 VITALS — BP 112/66 | HR 58 | Temp 97.3°F | Resp 10 | Ht 61.0 in | Wt 188.0 lb

## 2023-12-21 DIAGNOSIS — Z860100 Personal history of colon polyps, unspecified: Secondary | ICD-10-CM

## 2023-12-21 DIAGNOSIS — K5730 Diverticulosis of large intestine without perforation or abscess without bleeding: Secondary | ICD-10-CM

## 2023-12-21 DIAGNOSIS — Z1211 Encounter for screening for malignant neoplasm of colon: Secondary | ICD-10-CM | POA: Diagnosis not present

## 2023-12-21 DIAGNOSIS — D122 Benign neoplasm of ascending colon: Secondary | ICD-10-CM | POA: Diagnosis not present

## 2023-12-21 MED ORDER — SODIUM CHLORIDE 0.9 % IV SOLN: 500.0000 mL | Freq: Once | INTRAVENOUS | Status: DC

## 2023-12-21 NOTE — Progress Notes (Signed)
 Called to room to assist during endoscopic procedure.  Patient ID and intended procedure confirmed with present staff. Received instructions for my participation in the procedure from the performing physician.

## 2023-12-21 NOTE — Patient Instructions (Signed)

## 2023-12-21 NOTE — Progress Notes (Signed)
 History and Physical:  This patient presents for endoscopic testing for: Encounter Diagnosis  Name Primary?   Hx of colonic polyps Yes    Surveillance colonoscopy for Hx of TA in Feb 2018 Patient denies chronic abdominal pain, rectal bleeding, constipation or diarrhea.   Patient is otherwise without complaints or active issues today.   Past Medical History: Past Medical History:  Diagnosis Date   Arthritis    Fibroid    GERD (gastroesophageal reflux disease)    Hypertension      Past Surgical History: Past Surgical History:  Procedure Laterality Date   ABDOMINAL HYSTERECTOMY  11/11/1983   TAH   BREAST BIOPSY Right 07/08/2022   RETINAL TEAR REPAIR CRYOTHERAPY Right     Allergies: Allergies  Allergen Reactions   Penicillins Hives    Outpatient Meds: Current Outpatient Medications  Medication Sig Dispense Refill   aspirin 500 MG EC tablet Take 500 mg by mouth every 6 (six) hours as needed for pain.     Cholecalciferol (VITAMIN D3) 50 MCG (2000 UT) capsule 2 capsule capsules Once a day     Cyanocobalamin (VITAMIN B 12 PO) Take 1 tablet by mouth daily.      esomeprazole (NEXIUM) 20 MG capsule Take 20 mg by mouth daily at 12 noon.     ferrous sulfate 324 MG TBEC Take 324 mg by mouth daily.     Melatonin 5 MG CAPS Take by mouth daily.     PEG 3350-KCl-NaCl-NaSulf-MgSul (SUFLAVE ) 178.7 g SOLR Take 1 kit by mouth as directed. (Patient taking differently: Take 1 kit by mouth as directed. Colonoscopy schduled for 12/21/23. Follow office instructions.) 1 each 0   telmisartan (MICARDIS) 40 MG tablet Take 40 mg by mouth daily.     Accu-Chek Softclix Lancets lancets CHECK BLOOD SUGAR ONE TIME DAILY for 90 (Patient not taking: Reported on 12/21/2023)     acetaminophen (TYLENOL) 500 MG tablet Take 650 mg by mouth every 6 (six) hours as needed.     aspirin 81 MG tablet Take 325 mg by mouth as needed.  (Patient not taking: Reported on 09/17/2023)     Cholecalciferol (VITAMIN D  PO) Take  1 tablet by mouth daily.      diclofenac Sodium (VOLTAREN) 1 % GEL as directed Externally Twice a day PRN     glucose blood (ONETOUCH VERIO) test strip as directed In Vitro to check blood glucose once daily. E73.03 for 90 days (Patient not taking: Reported on 12/21/2023)     Inulin (FIBER CHOICE PO) Take by mouth.     Menthol, Topical Analgesic, (BIOFREEZE ROLL-ON EX) Biofreeze Roll-On     Multiple Vitamin (MULTIVITAMIN) tablet Take 1 tablet by mouth daily.   (Patient not taking: Reported on 09/17/2023)     naproxen (NAPROSYN) 500 MG tablet Take 500 mg by mouth 2 (two) times daily with a meal. (Patient not taking: Reported on 09/17/2023)     Current Facility-Administered Medications  Medication Dose Route Frequency Provider Last Rate Last Admin   0.9 %  sodium chloride  infusion  500 mL Intravenous Continuous Danis, Roel Clarity III, MD       0.9 %  sodium chloride  infusion  500 mL Intravenous Once Danis, Yoon Barca L III, MD          ___________________________________________________________________ Objective   Exam:  BP (!) 145/70   Pulse 84   Temp (!) 97.3 F (36.3 C) (Temporal)   Ht 5\' 1"  (1.549 m)   Wt 188 lb (85.3 kg)  LMP 04/10/1984   SpO2 100%   BMI 35.52 kg/m   CV: regular , S1/S2 Resp: clear to auscultation bilaterally, normal RR and effort noted GI: soft, no tenderness, with active bowel sounds.   Assessment: Encounter Diagnosis  Name Primary?   Hx of colonic polyps Yes     Plan: Colonoscopy   The benefits and risks of the planned procedure were described in detail with the patient or (when appropriate) their health care proxy.  Risks were outlined as including, but not limited to, bleeding, infection, perforation, adverse medication reaction leading to cardiac or pulmonary decompensation, pancreatitis (if ERCP).  The limitation of incomplete mucosal visualization was also discussed.  No guarantees or warranties were given.  The patient is appropriate for an  endoscopic procedure in the ambulatory setting.   - Lorella Roles, MD

## 2023-12-21 NOTE — Progress Notes (Signed)
 To pacu, VSS. Report to [email or web address]

## 2023-12-21 NOTE — Op Note (Signed)
 [facility] Patient Name: Heather Taylor Procedure Date: 12/21/2023 9:42 AM MRN: [id number] Endoscopist: Ace Abu L. Dominic Friendly , MD, [id number] Age: 73 Referring MD:  Date of Birth: [birth year] Gender: Female Account #: [id number] Procedure:                Colonoscopy Indications:              Surveillance: Personal history of adenomatous                            polyps on last colonoscopy > 5 years ago                           8mm Asc colon TA Feb 2018 Medicines:                Monitored Anesthesia Care Procedure:                Pre-Anesthesia Assessment:                           - Prior to the procedure, a History and Physical                            was performed, and patient medications and                            allergies were reviewed. The patient's tolerance of                            previous anesthesia was also reviewed. The risks                            and benefits of the procedure and the sedation                            options and risks were discussed with the patient.                            All questions were answered, and informed consent                            was obtained. Prior Anticoagulants: The patient has                            taken no anticoagulant or antiplatelet agents. ASA                            Grade Assessment: II - A patient with mild systemic                            disease. After reviewing the risks and benefits,                            the patient was deemed in satisfactory condition to  undergo the procedure.                           After obtaining informed consent, the colonoscope                            was passed under direct vision. Throughout the                            procedure, the patient's blood pressure, pulse, and                            oxygen saturations were monitored continuously. The                            CF [id number] #[id number] was introduced through  the anus                            and advanced to the the cecum, identified by                            appendiceal orifice and ileocecal valve. The                            colonoscopy was somewhat difficult due to multiple                            diverticula in the colon and a redundant colon.                            Successful completion of the procedure was aided by                            using manual pressure and straightening and                            shortening the scope to obtain bowel loop                            reduction. The patient tolerated the procedure                            well. The quality of the bowel preparation was                            good. The ileocecal valve, appendiceal orifice, and                            rectum were photographed. The bowel preparation                            used was SuFlav via split dose instruction. Scope In: 9:47:37 AM Scope Out: 10:05:23 AM Scope Withdrawal Time: 0 hours 10 minutes 15 seconds  Total Procedure  Duration: 0 hours 17 minutes 46 seconds  Findings:                 The perianal and digital rectal examinations were                            normal.                           Repeat examination of right colon under NBI                            performed.                           Two sessile polyps were found in the distal                            ascending colon. The polyps were diminutive in                            size. These polyps were removed with a cold snare.                            Resection and retrieval were complete.                           Multiple diverticula were found in the left colon.                           The exam was otherwise without abnormality on                            direct and retroflexion views. Complications:            No immediate complications. Estimated Blood Loss:     Estimated blood loss was minimal. Impression:               - Two  diminutive polyps in the distal ascending                            colon, removed with a cold snare. Resected and                            retrieved.                           - Diverticulosis in the left colon.                           - The examination was otherwise normal on direct                            and retroflexion views. Recommendation:           - Patient has a contact number available for  emergencies. The signs and symptoms of potential                            delayed complications were discussed with the                            patient. Return to normal activities tomorrow.                            Written discharge instructions were provided to the                            patient.                           - Resume previous diet.                           - Continue present medications.                           - Await pathology results.                           - Repeat colonoscopy may be recommended for                            surveillance. That will be determined after                            pathology results from today's exam become                            available for review. Dardan Shelton L. Dominic Friendly, MD 12/21/2023 10:09:19 AM This report has been signed electronically.

## 2023-12-22 ENCOUNTER — Telehealth: Payer: Self-pay | Admitting: *Deleted

## 2023-12-22 NOTE — Telephone Encounter (Signed)
Left message on f/u call

## 2023-12-23 LAB — SURGICAL PATHOLOGY

## 2023-12-28 ENCOUNTER — Encounter: Payer: Self-pay | Admitting: Gastroenterology

## 2024-02-02 DIAGNOSIS — I10 Essential (primary) hypertension: Secondary | ICD-10-CM | POA: Diagnosis not present

## 2024-02-02 DIAGNOSIS — R7303 Prediabetes: Secondary | ICD-10-CM | POA: Diagnosis not present

## 2024-04-19 DIAGNOSIS — I10 Essential (primary) hypertension: Secondary | ICD-10-CM | POA: Diagnosis not present

## 2024-04-19 DIAGNOSIS — J069 Acute upper respiratory infection, unspecified: Secondary | ICD-10-CM | POA: Diagnosis not present

## 2024-05-04 ENCOUNTER — Other Ambulatory Visit: Payer: Medicare HMO

## 2024-06-27 ENCOUNTER — Other Ambulatory Visit: Payer: Self-pay | Admitting: Internal Medicine

## 2024-06-27 DIAGNOSIS — Z1231 Encounter for screening mammogram for malignant neoplasm of breast: Secondary | ICD-10-CM

## 2024-08-01 ENCOUNTER — Ambulatory Visit
Admission: RE | Admit: 2024-08-01 | Discharge: 2024-08-01 | Disposition: A | Discharge status: Home / Self Care | Source: Ambulatory Visit | Attending: Internal Medicine | Admitting: Internal Medicine

## 2024-08-01 DIAGNOSIS — Z1231 Encounter for screening mammogram for malignant neoplasm of breast: Secondary | ICD-10-CM | POA: Diagnosis not present

## 2024-12-08 ENCOUNTER — Encounter (HOSPITAL_COMMUNITY): Payer: Self-pay

## 2024-12-08 ENCOUNTER — Ambulatory Visit (HOSPITAL_COMMUNITY)

## 2024-12-08 ENCOUNTER — Ambulatory Visit (HOSPITAL_COMMUNITY): Admission: EM | Admit: 2024-12-08 | Discharge: 2024-12-08 | Disposition: A | Discharge status: Home / Self Care

## 2024-12-08 DIAGNOSIS — M79632 Pain in left forearm: Secondary | ICD-10-CM | POA: Diagnosis not present

## 2024-12-08 DIAGNOSIS — W19XXXA Unspecified fall, initial encounter: Secondary | ICD-10-CM | POA: Diagnosis not present

## 2024-12-08 HISTORY — DX: Pure hypercholesterolemia, unspecified: E78.00

## 2024-12-08 NOTE — ED Provider Notes (Signed)
 1. No acute bony abnormality.  [facility]    CSN: [id number] Arrival date & time: 12/08/24  1441      History   Chief Complaint Chief Complaint  Patient presents with   Fall    HPI Heather Taylor is a 74 y.o. female Patient states she had a fall on 11/27/24 and had left shoulder pain. States she slipped on a piece of wet wood in her yard. She fell backwards and landed on her back. Suspect she used her L arm to catch herself as she fell. Patient states I have been doctoring on it with heating pad, Tylenol, Arthritis Hot, green alcohol, cool and heat cream, Nerve, miracle rub.   Patient states she is able to raise her left arm to her head now, but still has discomfort with supination and pronation and is now having pain from the elbow to the wrist.   The patient denies pain or injury elsewhere.  Denies head trauma.  HPI  Past Medical History:  Diagnosis Date   Arthritis    Fibroid    GERD (gastroesophageal reflux disease)    High cholesterol    Hypertension     Patient Active Problem List   Diagnosis Date Noted   Chronic kidney disease, stage 3a (HCC) 05/05/2022   Long term (current) use of non-steroidal anti-inflammatories (nsaid) 05/05/2022   Gastroesophageal reflux disease 05/05/2022   History of asthma 05/05/2022   Penicillin allergy 05/05/2022   Prediabetes 05/05/2022   Primary hypertension 05/05/2022   Pure hypercholesterolemia 05/05/2022   Snoring 05/05/2022   Vitamin D  deficiency 05/05/2022   Osteoarthritis 05/05/2022   Morbid obesity (HCC) 05/05/2022   Primary osteoarthritis of right hip 05/05/2022   Osteopenia    Arthritis     Past Surgical History:  Procedure Laterality Date   ABDOMINAL HYSTERECTOMY  11/11/1983   TAH   BREAST BIOPSY Right 07/08/2022   EYE SURGERY     RETINAL TEAR REPAIR CRYOTHERAPY Right     OB History     Gravida  0   Para  0   Term  0   Preterm  0   AB  0   Living  0      SAB  0   IAB  0    Ectopic  0   Multiple  0   Live Births  0            Home Medications    Prior to Admission medications  Medication Sig Start Date End Date Taking? Authorizing Provider  magnesium gluconate (MAGONATE) 500 (27 Mg) MG TABS tablet Take 250 mg by mouth in the morning and at bedtime.   Yes [provider]  rosuvastatin (CRESTOR) 5 MG tablet Take 5 mg by mouth daily.   Yes [provider]  acetaminophen (TYLENOL) 500 MG tablet Take 650 mg by mouth every 6 (six) hours as needed.    [provider]  Cholecalciferol (VITAMIN D  PO) Take 1 tablet by mouth daily.     [provider]  Cholecalciferol (VITAMIN D3) 50 MCG (2000 UT) capsule 2 capsule capsules Once a day    [provider]  Cyanocobalamin (VITAMIN B 12 PO) Take 1 tablet by mouth daily.     [provider]  diclofenac Sodium (VOLTAREN) 1 % GEL as directed Externally Twice a day PRN 08/24/19   [provider]  esomeprazole (NEXIUM) 20 MG capsule Take 20 mg by mouth daily at 12 noon.  [provider]  ferrous sulfate 324 MG TBEC Take 324 mg by mouth daily.    [provider]  Inulin (FIBER CHOICE PO) Take by mouth.    [provider]  Melatonin 5 MG CAPS Take by mouth daily.    [provider]  Menthol, Topical Analgesic, (BIOFREEZE ROLL-ON EX) Biofreeze Roll-On    [provider]  PEG 3350-KCl-NaCl-NaSulf-MgSul (SUFLAVE ) 178.7 g SOLR Take 1 kit by mouth as directed. Patient taking differently: Take 1 kit by mouth as directed. Colonoscopy schduled for 12/21/23. Follow office instructions. 12/07/23   Legrand Victory LITTIE DOUGLAS, MD  telmisartan (MICARDIS) 40 MG tablet Take 40 mg by mouth daily.    [provider]    Family History Family History  Problem Relation Age of Onset   Thyroid  disease Mother    Hypertension Father    Heart disease Father    Diabetes Father    Pulmonary fibrosis Sister    Heart failure Maternal Aunt     Hypertension Brother    Heart disease Brother    Hyperlipidemia Brother    Colon cancer Neg Hx    Esophageal cancer Neg Hx    Rectal cancer Neg Hx    Stomach cancer Neg Hx    Breast cancer Neg Hx     Social History Social History[1]   Allergies   Penicillins   Review of Systems Review of Systems  Musculoskeletal:        L arm pain     Physical Exam Triage Vital Signs ED Triage Vitals [12/08/24 1506]  Encounter Vitals Group     BP (!) 148/89     Girls Systolic BP Percentile      Girls Diastolic BP Percentile      Boys Systolic BP Percentile      Boys Diastolic BP Percentile      Pulse Rate 72     Resp 16     Temp 97.9 F (36.6 C)     Temp Source Oral     SpO2 95 %     Weight      Height      Head Circumference      Peak Flow      Pain Score 8     Pain Loc      Pain Education      Exclude from Growth Chart    No data found.  Updated Vital Signs BP (!) 148/89 (BP Location: Right Arm)   Pulse 72   Temp 97.9 F (36.6 C) (Oral)   Resp 16   LMP 04/10/1984   SpO2 95%   Visual Acuity Right Eye Distance:   Left Eye Distance:   Bilateral Distance:    Right Eye Near:   Left Eye Near:    Bilateral Near:     Physical Exam Vitals reviewed.  Constitutional:      General: She is not in acute distress.    Appearance: Normal appearance. She is not ill-appearing.  HENT:     Head: Normocephalic and atraumatic.  Pulmonary:     Effort: Pulmonary effort is normal.  Musculoskeletal:     Comments: Left arm: No skin changes or effusion.  There is no tenderness to palpation over the cervical spine, the proximal shoulder, the clavicle, or the glenohumeral joint.  There is mild tenderness to palpation extending from the elbow to the wrist, but no point tenderness or bony tenderness.  Pain is elicited with supination and pronation.  No snuffbox tenderness,  grip strength 5/5, cap refill less than 2 seconds, skin is warm and well-perfused.  Neurological:      General: No focal deficit present.     Mental Status: She is alert and oriented to person, place, and time.  Psychiatric:        Mood and Affect: Mood normal.        Behavior: Behavior normal.        Thought Content: Thought content normal.        Judgment: Judgment normal.      UC Treatments / Results  Labs (all labs ordered are listed, but only abnormal results are displayed) Labs Reviewed - No data to display  EKG   Radiology No results found.  Procedures Procedures (including critical care time)  Medications Ordered in UC Medications - No data to display  Initial Impression / Assessment and Plan / UC Course  I have reviewed the triage vital signs and the nursing notes.  Pertinent labs & imaging results that were available during my care of the patient were reviewed by me and considered in my medical decision making (see chart for details).     Patient is a pleasant 74 y.o. female presenting with L forearm pain, following a fall that occurred 11/27/24.  No red flag or radicular symptoms.    Chart review indicates a history of CKD stage III, but I do not have a recent kidney function for her.  She states that she has been told she can take NSAIDs.  X-ray left forearm: 1. No acute bony abnormality.   Continue home interventions (see HPI).  Final Clinical Impressions(s) / UC Diagnoses   Final diagnoses:  Fall, initial encounter  Left forearm pain     Discharge Instructions      -Your xray looks okay to me. I will call in 1-2 hours if the radiologist sees something that I missed. You will not get a call with normal results.  -Continue the interventions that you have been doing at home.       ED Prescriptions   None    PDMP not reviewed this encounter.     [1]  Social History Tobacco Use   Smoking status: Never   Smokeless tobacco: Never  Vaping Use   Vaping status: Never Used  Substance Use Topics   Alcohol use: No   Drug use: No      Arlyss Leita BRAVO, PA-C 12/08/24 1701

## 2024-12-08 NOTE — Discharge Instructions (Addendum)
-  Your xray looks okay to me. I will call in 1-2 hours if the radiologist sees something that I missed. You will not get a call with normal results.  -Continue the interventions that you have been doing at home.

## 2024-12-08 NOTE — ED Triage Notes (Signed)
 Patient states she had a fall on 11/27/24 and had left shoulder pain. Patient states I have been doctoring on it with heating pad, Tylenol, Arthritis Hot, green alcohol, cool and heat cream, Nerve, miracle rub.  Patient states she is able to raise her left arm to her head now, but still has discomfort when straightening her arm out and is now having pain from the shoulder to the wrist.
